# Patient Record
Sex: Male | Born: 1955 | Race: White | Hispanic: No | Marital: Married | Smoking: Never smoker
Health system: Southern US, Community
[De-identification: ages and names within clinical notes are randomized; demographics above are authoritative.]

## PROBLEM LIST (undated history)

## (undated) DIAGNOSIS — E785 Hyperlipidemia, unspecified: Secondary | ICD-10-CM

## (undated) DIAGNOSIS — R569 Unspecified convulsions: Secondary | ICD-10-CM

## (undated) DIAGNOSIS — E878 Other disorders of electrolyte and fluid balance, not elsewhere classified: Secondary | ICD-10-CM

## (undated) DIAGNOSIS — F329 Major depressive disorder, single episode, unspecified: Secondary | ICD-10-CM

## (undated) DIAGNOSIS — F419 Anxiety disorder, unspecified: Secondary | ICD-10-CM

## (undated) DIAGNOSIS — J189 Pneumonia, unspecified organism: Secondary | ICD-10-CM

## (undated) HISTORY — DX: Major depressive disorder, single episode, unspecified: F32.9

## (undated) HISTORY — PX: TONSILLECTOMY: SUR1361

## (undated) HISTORY — DX: Anxiety disorder, unspecified: F41.9

## (undated) HISTORY — DX: Other disorders of electrolyte and fluid balance, not elsewhere classified: E87.8

## (undated) HISTORY — DX: Pneumonia, unspecified organism: J18.9

## (undated) HISTORY — DX: Hyperlipidemia, unspecified: E78.5

## (undated) HISTORY — DX: Unspecified convulsions: R56.9

---

## 2003-06-04 ENCOUNTER — Encounter: Payer: Self-pay | Admitting: Internal Medicine

## 2003-06-04 ENCOUNTER — Ambulatory Visit (HOSPITAL_COMMUNITY): Admission: RE | Admit: 2003-06-04 | Discharge: 2003-06-04 | Payer: Self-pay | Admitting: Internal Medicine

## 2006-02-24 DIAGNOSIS — F419 Anxiety disorder, unspecified: Secondary | ICD-10-CM

## 2006-02-24 HISTORY — DX: Anxiety disorder, unspecified: F41.9

## 2006-05-27 DIAGNOSIS — E878 Other disorders of electrolyte and fluid balance, not elsewhere classified: Secondary | ICD-10-CM

## 2006-05-27 DIAGNOSIS — F32A Depression, unspecified: Secondary | ICD-10-CM

## 2006-05-27 DIAGNOSIS — J189 Pneumonia, unspecified organism: Secondary | ICD-10-CM

## 2006-05-27 DIAGNOSIS — F419 Anxiety disorder, unspecified: Secondary | ICD-10-CM

## 2006-05-27 HISTORY — DX: Other disorders of electrolyte and fluid balance, not elsewhere classified: E87.8

## 2006-05-27 HISTORY — DX: Anxiety disorder, unspecified: F41.9

## 2006-05-27 HISTORY — DX: Depression, unspecified: F32.A

## 2006-05-27 HISTORY — DX: Pneumonia, unspecified organism: J18.9

## 2006-06-08 ENCOUNTER — Other Ambulatory Visit: Payer: Self-pay

## 2006-06-08 ENCOUNTER — Inpatient Hospital Stay: Payer: Self-pay | Admitting: Internal Medicine

## 2006-06-19 ENCOUNTER — Ambulatory Visit: Payer: Self-pay | Admitting: Pulmonary Disease

## 2006-09-27 ENCOUNTER — Ambulatory Visit: Payer: Self-pay | Admitting: Family Medicine

## 2006-09-27 DIAGNOSIS — E785 Hyperlipidemia, unspecified: Secondary | ICD-10-CM

## 2006-09-27 HISTORY — DX: Hyperlipidemia, unspecified: E78.5

## 2006-09-27 LAB — CONVERTED CEMR LAB
ALT: 33 units/L (ref 0–40)
AST: 35 units/L (ref 0–37)
Albumin: 4.3 g/dL (ref 3.5–5.2)
Alkaline Phosphatase: 79 units/L (ref 39–117)
BUN: 6 mg/dL (ref 6–23)
Basophils Absolute: 0 10*3/uL (ref 0.0–0.1)
Basophils Relative: 0.3 % (ref 0.0–1.0)
Bilirubin, Direct: 0.1 mg/dL (ref 0.0–0.3)
CO2: 28 meq/L (ref 19–32)
Calcium: 9.4 mg/dL (ref 8.4–10.5)
Chloride: 103 meq/L (ref 96–112)
Cholesterol: 200 mg/dL (ref 0–200)
Creatinine, Ser: 1 mg/dL (ref 0.4–1.5)
Eosinophils Absolute: 0.1 10*3/uL (ref 0.0–0.6)
Eosinophils Relative: 1.2 % (ref 0.0–5.0)
GFR calc Af Amer: 102 mL/min
GFR calc non Af Amer: 84 mL/min
Glucose, Bld: 98 mg/dL (ref 70–99)
HCT: 46.1 % (ref 39.0–52.0)
HDL: 49.3 mg/dL (ref >39.0)
Hemoglobin: 16 g/dL (ref 13.0–17.0)
LDL Cholesterol: 131 mg/dL — ABNORMAL HIGH (ref 0–99)
Lymphocytes Relative: 29.2 % (ref 12.0–46.0)
MCHC: 34.6 g/dL (ref 30.0–36.0)
MCV: 91.5 fL (ref 78.0–100.0)
Monocytes Absolute: 0.5 10*3/uL (ref 0.2–0.7)
Monocytes Relative: 10 % (ref 3.0–11.0)
Neutro Abs: 2.7 10*3/uL (ref 1.4–7.7)
Neutrophils Relative %: 59.3 % (ref 43.0–77.0)
PSA: 0.46 ng/mL
PSA: 0.46 ng/mL (ref 0.10–4.00)
Platelets: 200 10*3/uL (ref 150–400)
Potassium: 3.8 meq/L (ref 3.5–5.1)
RBC: 5.04 M/uL (ref 4.22–5.81)
RDW: 12 % (ref 11.5–14.6)
Sodium: 142 meq/L (ref 135–145)
TSH: 1.71 microintl units/mL (ref 0.35–5.50)
Total Bilirubin: 0.7 mg/dL (ref 0.3–1.2)
Total CHOL/HDL Ratio: 4.1
Total Protein: 6.7 g/dL (ref 6.0–8.3)
Triglycerides: 99 mg/dL (ref 0–149)
VLDL: 20 mg/dL (ref 0–40)
WBC: 4.7 10*3/uL (ref 4.5–10.5)

## 2006-10-15 ENCOUNTER — Ambulatory Visit: Payer: Self-pay | Admitting: Family Medicine

## 2006-11-27 ENCOUNTER — Encounter: Payer: Self-pay | Admitting: Family Medicine

## 2006-11-27 DIAGNOSIS — E785 Hyperlipidemia, unspecified: Secondary | ICD-10-CM | POA: Insufficient documentation

## 2006-11-27 DIAGNOSIS — F411 Generalized anxiety disorder: Secondary | ICD-10-CM | POA: Insufficient documentation

## 2006-11-28 ENCOUNTER — Ambulatory Visit: Payer: Self-pay | Admitting: Family Medicine

## 2007-06-23 ENCOUNTER — Telehealth (INDEPENDENT_AMBULATORY_CARE_PROVIDER_SITE_OTHER): Payer: Self-pay | Admitting: *Deleted

## 2007-07-16 ENCOUNTER — Ambulatory Visit: Payer: Self-pay | Admitting: Family Medicine

## 2007-08-04 ENCOUNTER — Telehealth: Payer: Self-pay | Admitting: Family Medicine

## 2007-08-05 ENCOUNTER — Encounter: Payer: Self-pay | Admitting: Family Medicine

## 2007-12-23 ENCOUNTER — Emergency Department: Payer: Self-pay | Admitting: Emergency Medicine

## 2008-06-09 ENCOUNTER — Ambulatory Visit: Payer: Self-pay | Admitting: Family Medicine

## 2008-06-11 ENCOUNTER — Ambulatory Visit: Payer: Self-pay | Admitting: Family Medicine

## 2008-06-11 DIAGNOSIS — R7309 Other abnormal glucose: Secondary | ICD-10-CM

## 2008-06-14 LAB — CONVERTED CEMR LAB
ALT: 51 units/L (ref 0–53)
AST: 35 units/L (ref 0–37)
Albumin: 4.2 g/dL (ref 3.5–5.2)
Alkaline Phosphatase: 88 units/L (ref 39–117)
BUN: 18 mg/dL (ref 6–23)
Basophils Absolute: 0 10*3/uL (ref 0.0–0.1)
Basophils Relative: 0.1 % (ref 0.0–3.0)
Bilirubin, Direct: 0.1 mg/dL (ref 0.0–0.3)
CO2: 27 meq/L (ref 19–32)
Calcium: 9.2 mg/dL (ref 8.4–10.5)
Chloride: 107 meq/L (ref 96–112)
Cholesterol: 221 mg/dL (ref 0–200)
Creatinine, Ser: 1.1 mg/dL (ref 0.4–1.5)
Direct LDL: 124.9 mg/dL
Eosinophils Absolute: 0.1 10*3/uL (ref 0.0–0.7)
Eosinophils Relative: 1.2 % (ref 0.0–5.0)
GFR calc Af Amer: 91 mL/min
GFR calc non Af Amer: 75 mL/min
Glucose, Bld: 103 mg/dL — ABNORMAL HIGH (ref 70–99)
HCT: 45.2 % (ref 39.0–52.0)
HDL: 48.5 mg/dL (ref >39.0)
Hemoglobin: 15.7 g/dL (ref 13.0–17.0)
Lymphocytes Relative: 14.5 % (ref 12.0–46.0)
MCHC: 34.8 g/dL (ref 30.0–36.0)
MCV: 92.5 fL (ref 78.0–100.0)
Monocytes Absolute: 0.5 10*3/uL (ref 0.1–1.0)
Monocytes Relative: 7.7 % (ref 3.0–12.0)
Neutro Abs: 5.4 10*3/uL (ref 1.4–7.7)
Neutrophils Relative %: 76.5 % (ref 43.0–77.0)
PSA: 0.5 ng/mL (ref 0.10–4.00)
Platelets: 173 10*3/uL (ref 150–400)
Potassium: 4 meq/L (ref 3.5–5.1)
RBC: 4.89 M/uL (ref 4.22–5.81)
RDW: 11.8 % (ref 11.5–14.6)
Sodium: 141 meq/L (ref 135–145)
TSH: 2.14 microintl units/mL (ref 0.35–5.50)
Total Bilirubin: 0.9 mg/dL (ref 0.3–1.2)
Total CHOL/HDL Ratio: 4.6
Total Protein: 6.6 g/dL (ref 6.0–8.3)
Triglycerides: 100 mg/dL (ref 0–149)
VLDL: 20 mg/dL (ref 0–40)
WBC: 7 10*3/uL (ref 4.5–10.5)

## 2008-11-09 ENCOUNTER — Ambulatory Visit: Payer: Self-pay | Admitting: Family Medicine

## 2008-11-09 DIAGNOSIS — N401 Enlarged prostate with lower urinary tract symptoms: Secondary | ICD-10-CM

## 2009-08-25 ENCOUNTER — Telehealth: Payer: Self-pay | Admitting: Family Medicine

## 2009-10-28 ENCOUNTER — Telehealth: Payer: Self-pay | Admitting: Internal Medicine

## 2010-02-13 ENCOUNTER — Telehealth: Payer: Self-pay | Admitting: Family Medicine

## 2010-02-14 ENCOUNTER — Telehealth: Payer: Self-pay | Admitting: Family Medicine

## 2010-03-20 ENCOUNTER — Telehealth: Payer: Self-pay | Admitting: Family Medicine

## 2010-03-30 ENCOUNTER — Encounter (INDEPENDENT_AMBULATORY_CARE_PROVIDER_SITE_OTHER): Payer: Self-pay | Admitting: *Deleted

## 2010-08-27 DIAGNOSIS — R569 Unspecified convulsions: Secondary | ICD-10-CM

## 2010-08-27 HISTORY — DX: Unspecified convulsions: R56.9

## 2010-08-30 ENCOUNTER — Telehealth: Payer: Self-pay | Admitting: Family Medicine

## 2010-08-31 ENCOUNTER — Ambulatory Visit
Admission: RE | Admit: 2010-08-31 | Discharge: 2010-08-31 | Payer: Self-pay | Source: Home / Self Care | Attending: Family Medicine | Admitting: Family Medicine

## 2010-08-31 ENCOUNTER — Other Ambulatory Visit: Payer: Self-pay | Admitting: Family Medicine

## 2010-08-31 LAB — HEPATIC FUNCTION PANEL
ALT: 34 U/L (ref 0–53)
AST: 25 U/L (ref 0–37)
Albumin: 4.1 g/dL (ref 3.5–5.2)
Alkaline Phosphatase: 73 U/L (ref 39–117)
Bilirubin, Direct: 0.1 mg/dL (ref 0.0–0.3)
Total Bilirubin: 0.7 mg/dL (ref 0.3–1.2)
Total Protein: 6.6 g/dL (ref 6.0–8.3)

## 2010-08-31 LAB — BASIC METABOLIC PANEL
BUN: 18 mg/dL (ref 6–23)
CO2: 28 mEq/L (ref 19–32)
Calcium: 9.1 mg/dL (ref 8.4–10.5)
Chloride: 103 mEq/L (ref 96–112)
Creatinine, Ser: 1.1 mg/dL (ref 0.4–1.5)
GFR: 75 mL/min (ref >60.00)
Glucose, Bld: 95 mg/dL (ref 70–99)
Potassium: 4.4 mEq/L (ref 3.5–5.1)
Sodium: 138 mEq/L (ref 135–145)

## 2010-08-31 LAB — LIPID PANEL
Cholesterol: 230 mg/dL — ABNORMAL HIGH (ref 0–200)
HDL: 47.8 mg/dL (ref >39.00)
Total CHOL/HDL Ratio: 5
Triglycerides: 82 mg/dL (ref 0.0–149.0)
VLDL: 16.4 mg/dL (ref 0.0–40.0)

## 2010-08-31 LAB — LDL CHOLESTEROL, DIRECT: Direct LDL: 155.8 mg/dL

## 2010-09-01 ENCOUNTER — Ambulatory Visit
Admission: RE | Admit: 2010-09-01 | Discharge: 2010-09-01 | Payer: Self-pay | Source: Home / Self Care | Attending: Family Medicine | Admitting: Family Medicine

## 2010-09-01 DIAGNOSIS — M549 Dorsalgia, unspecified: Secondary | ICD-10-CM | POA: Insufficient documentation

## 2010-09-03 ENCOUNTER — Telehealth: Payer: Self-pay | Admitting: Family Medicine

## 2010-09-03 DIAGNOSIS — R569 Unspecified convulsions: Secondary | ICD-10-CM | POA: Insufficient documentation

## 2010-09-26 NOTE — Letter (Signed)
Summary: Nadara Eaton letter  [facility]  [address] [place], [place] [address]   Phone: [phone]  Fax: [phone]       03/30/2010 MRN: [id number]  [facility] [address] [place], [place]  [address]  Dear Mr. Copland,  [facility] [place] and [place] announce the retirement of Arta Silence, M.D., from full-time practice at the [facility] office effective February 23, 2010 and his plans of returning part-time.  It is important to Dr. Hetty Ely and to our practice that you understand that [facility] has seven physicians in our office for your health care needs.  We will continue to offer the same exceptional care that you have today.    Dr. Hetty Ely has spoken to many of you about his plans for retirement and returning part-time in the fall.   We will continue to work with you through the transition to schedule appointments for you in the office and meet the high standards that [facility] is committed to.   Again, it is with great pleasure that we share the news that Dr. Hetty Ely will return to [facility] in October of 2011 with a reduced schedule.    If you have any questions, or would like to request an appointment with one of our physicians, please call us at [phone] and press the option for Scheduling an appointment.  We take pleasure in providing you with excellent patient care and look forward to seeing you at your next office visit.  Our [facility] Physicians are:  Tillman Abide, M.D. Laurita Quint, M.D. Roxy Manns, M.D. Kerby Nora, M.D. Hannah Beat, M.D. Ruthe Mannan, M.D. We proudly welcomed Raechel Ache, M.D. and Eustaquio Boyden, M.D. to the practice in July/August 2011.  Sincerely,  [facility]

## 2010-09-26 NOTE — Progress Notes (Signed)
Summary: Questions about med  Phone Note Call from Patient Call back at [phone]   Caller: Patient Call For: Shaune Leeks MD Summary of Call: Request a call from you about his medication.  Initial call taken by: Sydell Axon LPN,  February 14, 2010 4:20 PM  Follow-up for Phone Call        Pt is having lots of poressure at work trying to see all the pt's he is to see a day and has needed something in the middle of the day to calm him down. He has not been on Zoloft for some time and was tqlking to a friend who is on Celexa. He wondered my opinion. I think Zoloft is an excellent medication for anxiety, has been around longer with more experience and he has already used it successfully in the past. My suggestion would be to use Zoloft. He has been breaking his Xanax in two. I have called in a higher dose to be used which should help altho he needs tobe seen to get more. Follow-up by: Shaune Leeks MD,  February 14, 2010 4:45 PM    New/Updated Medications: ZOLOFT 50 MG TABS (SERTRALINE HCL) 1/2 tab by mouth daily for 4-5 days then 1 tab Prescriptions: ZOLOFT 50 MG TABS (SERTRALINE HCL) 1/2 tab by mouth daily for 4-5 days then 1 tab  #30 x 12   Entered and Authorized by:   Shaune Leeks MD   Signed by:   Shaune Leeks MD on 02/14/2010   Method used:   [facility]ron[facility]        [facility] [phone]* (retail)       [address] [place], [place]  [address]       Ph: [phone] or [phone]       Fax: [phone]   RxID:   [id number]

## 2010-09-26 NOTE — Progress Notes (Signed)
Summary: Rx Zoloft  Phone Note Refill Request Call back at Home Phone [phone] Message from:  Patient on October 28, 2009 9:24 AM  Refills Requested: Medication #1:  ALPRAZOLAM 0.25 MG  TABS 1 by mouth two times a day prn Patient request refill, he is aware that Dr. Hetty Ely is out of the office today and that I will send the message to another provider but there is no guarantee that it will be filled.  Please advise.   Method Requested: Telephone to Pharmacy Initial call taken by: Linde Gillis CMA Duncan Dull),  October 28, 2009 9:25 AM  Follow-up for Phone Call        okay to refill for 3 months have him schedule physical with Dr Hetty Ely within that time Follow-up by: Cindee Salt MD,  October 28, 2009 10:47 AM  Additional Follow-up for Phone Call Additional follow up Details #1::        Rx called to pharmacy, lmom that pt would need to schedule appt for physical Additional Follow-up by: DeShannon Katrinka Blazing CMA Duncan Dull),  October 28, 2009 12:03 PM    Prescriptions: ALPRAZOLAM 0.25 MG  TABS (ALPRAZOLAM) 1 by mouth two times a day prn  #60 x 2   Entered by:   Mervin Hack CMA (AAMA)   Authorized by:   Cindee Salt MD   Signed by:   Mervin Hack CMA (AAMA) on 10/28/2009   Method used:   Telephoned to ...       [facility] [phone]* (retail)       [address] [place], [place]  [address]       Ph: [phone] or [phone]       Fax: [phone]   RxID:   [id number]

## 2010-09-26 NOTE — Progress Notes (Signed)
Summary: Alprazolam  Phone Note Call from Patient Call back at Home Phone [phone]   Caller: Patient Call For: Shaune Leeks MD Summary of Call: Patient says that this 1/2 dose is just not doing it for him.  He was using it mainly to sleep but now he thinks he probably needs it just to get through the day to keep from worrying so much.  He is asking if you would phone in a higher dose and more tablets to last him until he can get in with one of the new MD's.  He says that will probably take 2-3 months.  I told him I thought he could likely get in within a month or so.  He asked me to send you this note anyway. Initial call taken by: Delilah Shan CMA Duncan Dull),  February 14, 2010 9:01 AM  Follow-up for Phone Call        Can call in if so desired. Shaune Leeks MD  February 14, 2010 9:33 AM   Medication phoned to pharmacy. Patient Advised.  Follow-up by: Delilah Shan CMA Duncan Dull),  February 14, 2010 9:51 AM    New/Updated Medications: ALPRAZOLAM 0.5 MG TBDP (ALPRAZOLAM) one tab by mouth two times a day as needed anxiety Prescriptions: ALPRAZOLAM 0.5 MG TBDP (ALPRAZOLAM) one tab by mouth two times a day as needed anxiety  #30 x 1   Entered and Authorized by:   Shaune Leeks MD   Signed by:   Shaune Leeks MD on 02/14/2010   Method used:   Print then Give to Patient   RxID:   [id number]

## 2010-09-26 NOTE — Progress Notes (Signed)
Summary: refill request for alprazolam  Phone Note Refill Request Message from:  Fax from Pharmacy  Refills Requested: Medication #1:  ALPRAZOLAM 0.5 MG TBDP one tab by mouth two times a day as needed anxiety   Last Refilled: 02/26/2010 [facility] [address]   [phone]  Initial call taken by: Lowella Petties CMA,  March 20, 2010 8:39 AM  Follow-up for Phone Call        please fill with same sig, #30, 1rf.  thanks.  have pt keep 8/11 appointment.  Follow-up by: Crawford Givens MD,  March 20, 2010 12:51 PM  Additional Follow-up for Phone Call Additional follow up Details #1::        Medication phoned to pharmacy.  Additional Follow-up by: Delilah Shan CMA Rene Sizelove Dull),  March 20, 2010 12:53 PM     Appended Document: refill request for alprazolam     Clinical Lists Changes  Medications: Rx of ALPRAZOLAM 0.5 MG TBDP (ALPRAZOLAM) one tab by mouth two times a day as needed anxiety;  #30 x 1;  Signed;  Entered by: Delilah Shan CMA (AAMA);  Authorized by: Crawford Givens MD;  Method used: Telephoned to    Prescriptions: ALPRAZOLAM 0.5 MG TBDP (ALPRAZOLAM) one tab by mouth two times a day as needed anxiety  #30 x 1   Entered by:   Delilah Shan CMA (AAMA)   Authorized by:   Crawford Givens MD   Signed by:   Delilah Shan CMA (AAMA) on 03/20/2010   Method used:   Telephoned to ...         RxID:   [id number]

## 2010-09-26 NOTE — Progress Notes (Signed)
Summary: Alprazolam  Phone Note Refill Request Message from:  Fax from Pharmacy on February 13, 2010 3:15 PM  Refills Requested: Medication #1:  ALPRAZOLAM 0.25 MG  TABS 1 by mouth two times a day prn [facility]  Phone:   [phone]   Method Requested: Telephone to Pharmacy Initial call taken by: Delilah Shan CMA Duncan Dull),  February 13, 2010 3:15 PM  Follow-up for Phone Call        Dr Alvester Morin needs to schedule a PE, now with Dr Para March or Dr Sharen Hones. They will probably not fill prescription w/o being seen. Follow-up by: Shaune Leeks MD,  February 13, 2010 3:50 PM  Additional Follow-up for Phone Call Additional follow up Details #1::        Medication phoned to pharmacy. Letter mailed.  Additional Follow-up by: Delilah Shan CMA Duncan Dull),  February 14, 2010 8:53 AM    Prescriptions: ALPRAZOLAM 0.25 MG  TABS (ALPRAZOLAM) 1 by mouth two times a day prn  #60 x 0   Entered and Authorized by:   Shaune Leeks MD   Signed by:   Shaune Leeks MD on 02/13/2010   Method used:   Telephoned to ...         RxID:   [id number]

## 2010-09-28 NOTE — Assessment & Plan Note (Signed)
Summary: TRANSFER FROM [facility]   Vital Signs:  Patient profile:   55 year old male Height:      69.50 inches Weight:      211 pounds BMI:     30.82 Temp:     98.2 degrees F oral Pulse rate:   84 / minute Pulse rhythm:   regular BP sitting:   116 / 80  (left arm) Cuff size:   large  Vitals Entered By: Delilah Shan CMA Waseem Suess Dull) 09-14-10 9:59 AM) CC: Transfer from [facility] - Lab report given to patient.  Pt. has concerns re:  request for pancreatic enzymes (see phone note), Preventive Care   History of Present Illness: CPE- see plan.  Labs reviewed with patient in detail.   Anxiety and insomnia- has been on zoloft episodically.  didn't have sig relief.  Taking xanax occ; prev had been up to 1 a day.  No SI/HI.  Now taking xanax intermittently.   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  I have no records to review about this.  No recent symptoms.   Occ L lower back pain, resolved now.  This had been going on for a few days before the OV.  No radiation into legs.  No FCNAVD. No blood in stool.    Allergies: No Known Drug Allergies  Past History:  Family History: Last updated: 09-14-2010 Father:DECEASED AGE 11/BRAIN  GLIOBLASTOMA/ HTN::  Mother::A Cataracts  both knee and both hips replaced Siblings:1 BROTHER ALIVE TESTICULR. CA and pituitary anenoma, HTN 1 BROTHER, MELONOMA  UJ:[id number] HBP: + FATHER, BROTHER DM NEGATIVE  PROSTATE CANCER:  NEGATIVE COLON CANCER: NEGATIVE DEPRESSION: + SELF (STILAMAN) ETOH/DRUG ABUSE + M UNCLE OTHER: STROKE NEGATIVE GF with h/o SZ d/o.    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   Past Surgical History: [facility]: PNEUMONIA / ELECTROLYTE DISORDER;  ANX./ DEPR. 10/13-10/17/2007 TONSILLECTOMY  CHILDHOOD COLONOSCOPY NML (DR ELLIOTT) 2003  Family History: Reviewed history from 11/09/2008 and no changes required. Father:DECEASED AGE 11/BRAIN  GLIOBLASTOMA/ HTN::  Mother::A Cataracts  both knee and both hips replaced Siblings:1 BROTHER ALIVE TESTICULR. CA and pituitary anenoma, HTN 1 BROTHER, MELONOMA  GN:FAOZHYQM HBP: + FATHER, BROTHER DM NEGATIVE  PROSTATE CANCER:  NEGATIVE COLON CANCER: NEGATIVE DEPRESSION: + SELF (STILAMAN) ETOH/DRUG ABUSE + M UNCLE OTHER: STROKE NEGATIVE GF with h/o SZ d/o.    Social History: Reviewed history from 11/09/2008 and no changes required. 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  Otherwise negative.    Physical Exam  General:  GEN: nad, alert and oriented HEENT: mucous membranes moist NECK: supple w/o LA CV: rrr.  no murmur PULM: ctab, no inc wob ABD: soft, +bs, no masses, no HSM BACK: not tender to palpation in midline.  He has been tender in the bilateral paraspinal lumbar muscles. no skin changes EXT: no edema SKIN: no acute rash  Rectal:  No external abnormalities noted. Normal sphincter tone. No rectal masses or tenderness. Prostate:  Prostate gland firm and smooth, no enlargement, nodularity, tenderness, mass, asymmetry or induration.   Impression & Recommendations:  Problem #  1:  Preventive Health Care (ICD-V70.0) Pt declined flu shot.  He is aware of potential mortality benefit with vaccine.  He declines anyway.  D/w patient at length about changes in PSA/prostate CA screening.  he is low risk w/o family history and normal DRE.  PSA is not recommended for him based on guidelines.  He understands.  No indication for checking pancreatic enzymes. He needs to work on diet/weight/lipids.   Problem # 2:  ANXIETY (ICD-300.00) He  can take the xanax as needed and if needed frequently, he'll notify the clinic about restarting the SSRI.  No SI/HI.  follow up as needed.  Sedation caution given for xanax.  His updated medication list for this problem includes:    Alprazolam 0.5 Mg Tbdp (Alprazolam) ..... One tab by mouth two times a day as needed anxiety    Zoloft 50 Mg Tabs (Sertraline hcl) .Marland Kitchen... 1/2 tab by mouth daily for 4-5 days then 1 tab  Problem # 3:  BACK PAIN (ICD-724.5) Resolved, follow up as needed.  Likely benign, self limited L spine muscle strain.   Complete Medication List: 1)  Alprazolam 0.5 Mg Tbdp (Alprazolam) .... One tab by mouth two times a day as needed anxiety 2)  Zoloft 50 Mg Tabs (Sertraline hcl) .... 1/2 tab by mouth daily for 4-5 days then 1 tab  Colorectal Screening:  Current Recommendations:    Hemoccult: NEG X 1 today  PSA Screening:    PSA: 0.50  (06/11/2008)  Immunization & Chemoprophylaxis:    Tetanus vaccine: Td  (09/27/2006)  Patient Instructions: 1)  I would get a flu shot.  2)  Stretch your back and let me know if you continue to have symptoms.  3)  I would increase your level of exercise and recheck your lipids in 6 months.  Fasting lipid panel 272.0 4)  Take care.  Prescriptions: ZOLOFT 50 MG TABS (SERTRALINE HCL) 1/2 tab by mouth daily for 4-5 days then 1 tab  #30 x 12   Entered and Authorized by:   Crawford Givens MD   Signed by:   Crawford Givens MD on 09/01/2010   Method used:   Print then Give to Patient   RxID:   [id number] ALPRAZOLAM 0.5 MG TBDP (ALPRAZOLAM) one tab by mouth two times a day as needed anxiety  #30 x 1   Entered and Authorized by:   Crawford Givens MD   Signed by:   Crawford Givens MD on 09/01/2010   Method used:   Print then Give to Patient   RxID:   [id number]    Orders Added: 1)  New Patient 40-64 years [99386] 2)  Est. Patient Level III [24401]      Prevention & Chronic Care Immunizations   Influenza vaccine: Not documented     Tetanus booster: 09/27/2006: Td    Pneumococcal vaccine: Not documented  Colorectal Screening   Hemoccult: Not documented   Hemoccult action/deferral: NEG X 1 today  (09/01/2010)    Colonoscopy: Per old chart, normal colonosocpy 2003 with Dr. Mechele Collin but no record is currently available for review  (08/27/2001)  Other Screening   PSA: 0.50  (06/11/2008)   Smoking status: never  (11/09/2008)  Lipids   Total Cholesterol: 230  (08/31/2010)   LDL: DEL  (06/11/2008)   LDL Direct: 155.8  (08/31/2010)   HDL: 47.80  (08/31/2010)   Triglycerides: 82.0  (08/31/2010)    SGOT (AST): 25  (08/31/2010)   SGPT (ALT): 34  (08/31/2010)   Alkaline phosphatase:  73  (08/31/2010)   Total bilirubin: 0.7  (08/31/2010)  Self-Management Support :    Lipid self-management support: Not documented     Colonoscopy  Procedure date:  08/27/2001  Findings:      Per old chart, normal colonosocpy 2003 with Dr. Mechele Collin but no record is currently available for review

## 2010-09-28 NOTE — Progress Notes (Signed)
  Phone Note Outgoing Call   Summary of Call: Please call patient.  I have no hard copy records of his possible seizure disorder and work up.  I have no copies of his colonosocpy, which per the old paper chart was done by Dr. Mechele Collin in 2003.  If he can get records on either or both of those, I'll review them.  Thanks.  Initial call taken by: Crawford Givens MD,  September 03, 2010 5:45 PM  Follow-up for Phone Call        Patient Advised.   He says he has some copies of his records from [facility] and will submit those to you and he will see what he can get from [facility].  In the meantime, he is asking if you would be comfortable with a referral to Dr. Sandria Manly in [place] to be evaluated.  Delilah Shan CMA Ivi Griffith Dull)  September 04, 2010 11:41 AM Follow-up by: Crawford Givens MD,  September 04, 2010 1:23 PM  Additional Follow-up for Phone Call Additional follow up Details #1::        referal done.  I'll look at records when he drops them off. Crawford Givens MD  September 04, 2010 1:24 PM   Patient Advised.  Additional Follow-up by: Delilah Shan CMA (AAMA),  September 04, 2010 2:17 PM  New Problems: SEIZURE DISORDER (ICD-780.39)   New Problems: SEIZURE DISORDER (ICD-780.39)

## 2010-09-28 NOTE — Progress Notes (Signed)
Summary: requests lab work  Phone Note Call from Patient Call back at [employer] [phone]   Caller: Patient Call For: Dr. Para March Summary of Call: Pt is coming to see you on friday, transferring from Dr. Hetty Ely.  He is asking if he can come in tomorrow for labs, particularly wants liver, pancreas and PSA checked. Initial call taken by: Lowella Petties CMA, AAMA,  August 30, 2010 9:31 AM  Follow-up for Phone Call        draw cmet/lipid 790.29 we can discuss PSA at OV.  I would not draw before the OV.  I see no indication for drawing 'pancreas' labs based on his history.  we can discuss at OV. Follow-up by: Crawford Givens MD,  August 30, 2010 11:29 AM  Additional Follow-up for Phone Call Additional follow up Details #1::        Patient Advised.   Lab appointment scheduled  08/31/2010. Additional Follow-up by: Delilah Shan CMA (AAMA),  August 30, 2010 11:48 AM    D

## 2010-10-19 ENCOUNTER — Encounter: Payer: Self-pay | Admitting: Family Medicine

## 2010-11-02 NOTE — Letter (Signed)
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 Eval by Dr. 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 Eval by Dr. Sandria Manly 2012

## 2011-01-12 NOTE — Assessment & Plan Note (Signed)
[facility]                             PULMONARY OFFICE NOTE   NAME:Jorge Obrien, Jorge Obrien                        MRN:          [id number]  DATE:06/19/2006                            DOB:          1956/08/22    REFERRING PHYSICIAN:  Self Referral   HISTORY OF PRESENT ILLNESS:  The patient is a 55 year old male who comes  in today for further pulmonary evaluation after a recent episode of  pneumonia. The patient was recently admitted to [facility] for 4 days with multilobar pneumonia. The patient has now been  out 7 days and feels that things are getting better but he just wanted a  second opinion regarding the possible etiology and whether the treatment  was appropriate or not. The patient feels that he is much improved and  that he has had no fevers, chills or sweats and his mucus is now white.  His shortness of breath is improving, but his primary complaint is that  of fatigue. I have reassured him this is fairly normal. This all began  with postnasal drip and hoarseness and eventually went down into his  chest. He began to develop shortness of breath, chest pain, fever and  nonproductive cough. He went to the emergency room where he was  documented to have multilobar pneumonia with hypotension and hemoptysis.  All of the cultures were negative while in the hospital but he was  treated with Levaquin IV and then changed over to p.o. He is currently  still on Levaquin.   PAST MEDICAL HISTORY:  Totally unremarkable.   CURRENT MEDICATIONS:  1. Celexa unknown dose daily.  2. Levaquin daily.   He is allergic to OMNICEF.   SOCIAL HISTORY:  The patient works as an Sport and exercise psychologist, is married and has  children. He smokes an occasional cigar and has never smoked cigarettes.   FAMILY HISTORY:  Remarkable for his father having had brain cancer and  brother with testicular cancer and malignant melanoma.   REVIEW OF SYSTEMS:  As per history of  present illness and also see  patient intake form documented in the chart.   PHYSICAL EXAMINATION:  GENERAL:  He is a well-developed male in no acute  distress.  VITAL SIGNS:  Blood pressure is 110/68, pulse 57, temperature 97.5,  weight is 185 pounds, O2 saturation on room air is 97%.  HEENT:  Pupils equal round and reactive to light and accommodation.  Extraocular muscles are intact. Nares are patent without discharge.  Oropharynx is clear.  NECK:  Supple without JVD or lymphadenopathy. There is no palpable  thyromegaly.  CHEST:  Totally clear to auscultation.  CARDIAC:  Reveals regular rate and rhythm, no murmurs, rubs or gallops.  ABDOMEN:  Soft, nontender with good bowel sounds.  GENITAL/RECTAL/BREASTS:  Not done and not indicated.  EXTREMITIES:  Lower extremities are without edema, good pulses distally,  no calf tenderness.  NEUROLOGIC:  Alert and oriented with no obvious motor deficits.   IMPRESSION:  Severe community-acquired pneumonia which is improving. The  patient is no longer having fever and his mucus  is now clear. He is  slowly getting back to his usual baseline. There are no crackles or  wheezes on exam. I think he needs to finish up his antibiotics and at  some point in the next 4-6 weeks he should have a followup chest x-ray  to make sure that it is totally clear. The patient is agreeable to this  plan as outlined.   PLAN:  1. The patient will contact his primary care physician to get a      followup film in approximately 4-6 weeks for comparison to his film      at [facility]  2. No further followup is needed at this time but I will be happy to      see him on a p.r.n. basis.     Barbaraann Share, MD,FCCP  Electronically Signed    [facility]  DD: 08/08/2006  DT: 08/08/2006  Job #: [id number]

## 2011-01-12 NOTE — Assessment & Plan Note (Signed)
[facility]                           [facility] OFFICE NOTE   NAME:Jorge Obrien, Jorge Obrien                        MRN:          [id number]  DATE:10/15/2006                            DOB:          [birth year]    SUBJECTIVE:  A 55 year old white male who is new to the practice his  last visit, which was September 27, 2006.  The patient was seen by  problems for several months.  He had shingles on June 05, 2006, in  the right thoracic area, biopsy-proven shingles via dermatology.  He was  not given Valtrex for it.  He was found kind of serendipitously on  examination when the patient was diagnosed with pneumonia at the  beginning of November.  He was diagnosed with double pneumonia and was  hospitalized for five days at [facility].  At that time  he was having some hemoptysis which resolved when his pneumonia was  treated.  He had a CT scan at that time, to rule out a pulmonary  embolism, which was indeed negative.  He had pneumonia previously when  he was 44-1/54 years old.  He is currently under significant anxiety and  distress, going through a divorce.  He has not exercised for about six  months now.  He used to exercise every other day, which has helped him  with his anxiety prior.  He has had no blood work since being out of the  hospital.   He also complains of hurting in the hips, which radiates to the legs  and/or pelvis.  His hands hurt in the past for awhile, none now.  Again,  he is the product of a divorce, which was asked for one year ago.  He is  still living in the house with his wife and child.  The wife has been  hesitant on the divorce.   PAST MEDICAL HISTORY:  1. Hospitalized for the pneumonia as above in October and November      2007.  2. Tonsillectomy as a child.  3. Borderline cholesterol.  4. He has had Streptococcus in the past.  He denies rheumatic fever, scarlet fever, high blood pressure, heart  disease,  asthma, diabetes, liver disease or thyroid disease.   ALLERGIES:  OMNICEF which causes welts on the skin in June 2007.   SOCIAL HISTORY:  He has never smoked.  Drinks two beers a night three  times a week.  Does not use drugs.   MEDICATIONS:  1. Citalopram 40 mg daily, which he had not taken as of September 27, 2006, for two weeks, prescribed by Dr. Randa Lynn in July of last year.  2. Xanax 0.25 mg, 1/2 tab p.r.n., which he took sparingly.   REVIEW OF SYSTEMS:  Significant for wearing glasses since his teen  years.  His last exam was in 2006.  He was tested for Glaucoma.  Has  occasional ringing of the ears.  Last dental exam was in February 2007.  Shortness of breath since his pneumonia.  Mild left anterior chest pain  at times,  which is non-exertional.  Does not cause diaphoresis or  nausea.  Does not worsen with exercise, but nothing makes it better as  well.  He has occasional palpitations, hemoptysis with pneumonia, which  is now resolved. He was told he had bleeding from his sinuses at the  same time.  He has a history of hemorrhoids in the past and can tell  when he flares.  He has had rectal pain in the past in November and  December of this year, presumably from his prostate.  He had a  colonoscopy in 2003, by Dr. Mechele Collin that was normal.  He has had urinary  frequency with nocturia two to three times a night and dribbling, which  started last year.  Otherwise the HEENT, TEETH, GUMS, CARDIORESPIRATORY,  GASTROINTESTINAL AND  GENITOURINARY:  SYSTEMS are noncontributory.   FAMILY HISTORY:  He has a brother who has had melanoma and has had  dermatology evaluation secondary thereto.  His father died at age 15, of  a brain tumor with high blood pressure.  Mother is still alive at the  age of 80.  Two brothers, one at age 46, who has had left testicular  cancer since age 56 and high blood pressure.  One at age 1, with  melanoma.   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  He is separated  from his wife, filing for a divorce.  Still lives at home with the wife  and child.   HEALTH MAINTENANCE:  Last Tetanus shot is unknown.   PHYSICAL EXAMINATION:  GENERAL:  A well-developed and well-nourished 55-  year-old white male, in no acute distress.  VITAL SIGNS:  Temperature 98.4 degrees, pulse 64, blood pressure 120/60,  weight 199 pounds at height of 71 inches.  HEENT:  Within normal limits.  Hearing is within normal limits.  NECK:  Without adenopathy.  Thyroid without nodularity.  LUNGS:  Clear to auscultation.  No wheezes, rales or rhonchi are heard.  BACK:  Straight, nontender, with no CVA tenderness.  Range of motion of  the back is reasonably normal, with mild discomfort at the anterior  superior iliac spine on the left.  HEART:  A regular rate and rhythm without murmur.  Pulses 2+ throughout.  Carotids are without bruits.  CHEST:  Symmetric with good excursion.  ABDOMEN:  Soft, nontender.  Good bowel sounds, no masses.   ASSESSMENT:  1. Anxiety with mild depression, which is situational due to his      divorce.  2. Night sweats that have resolved since being seen the first time.  3. Elevated cholesterol.  4. History of pneumonia.   PLAN:  1. A Tetanus shot was given on September 27, 2006.  2. A CT scan of the lungs was reviewed.  It was significant only for      infiltrates during his pneumonia.  3.  Will start him on Zoloft      today on October 15, 2006, at 1/2 of a 50 mg tablet a day for      eight days and then one tablet a day, #30 and 12 refills.  3. Continue Xanax 0.25 mg, 1/2 to 1 tab t.i.d. p.r.n., #30 and no      refills.  He actually uses 1/2 tab as infrequently as five to seven      days.  4. The left anterior superior iliac spine discomfort is probably      secondary to latissimus dorsi strain.  It should  improve as time      goes on.  He was reassured.  Will discuss osteo facts     next time I see him.  5. Will get  fasting labs in the future.  6. Return as needed.  Will see him back in three months.     Arta Silence, MD  Electronically Signed    RNS/MedQ  DD: 10/15/2006  DT: 10/15/2006  Job #: [id number]

## 2011-11-01 ENCOUNTER — Other Ambulatory Visit: Payer: Self-pay | Admitting: *Deleted

## 2011-11-01 NOTE — Telephone Encounter (Signed)
Received fax refill request for Alprazolam 0.5mg , medication is not on med list.  Please advise.

## 2011-11-02 MED ORDER — ALPRAZOLAM 0.5 MG PO TABS: 0.5000 mg | ORAL_TABLET | Freq: Two times a day (BID) | ORAL | Status: AC | PRN

## 2011-11-02 NOTE — Telephone Encounter (Signed)
LMOVM of patient's home/cell phone.  Medication phoned to pharmacy.

## 2011-11-02 NOTE — Telephone Encounter (Signed)
Needs OV re: anxiety.  Not seen in 1 year.  Please call in med after verifying pharmacy.

## 2011-11-03 ENCOUNTER — Ambulatory Visit: Payer: Self-pay | Admitting: Internal Medicine

## 2011-11-16 ENCOUNTER — Telehealth: Payer: Self-pay

## 2011-11-16 NOTE — Telephone Encounter (Signed)
Spoke with pt and he verified Alprazolam was the only current med and asked that med list be mailed to [address] [place], [place] [address]. Mailed current med list as requested.

## 2011-11-16 NOTE — Telephone Encounter (Signed)
Pt left v/m that wanted current med list mailed to his home address [address] [place], [place] [address]. I called left v/m for pt to call back to verify his med list.

## 2012-07-02 ENCOUNTER — Other Ambulatory Visit: Payer: Self-pay

## 2012-07-02 NOTE — Telephone Encounter (Signed)
Pt request refill alprazolam; pt not seen since 09/01/10; pt said would call back to schedule appt. Pt also request copy of med list for insurance purposes. Med list mailed as requested.

## 2012-07-02 NOTE — Telephone Encounter (Signed)
Per EMR, message was left with patient to schedule appointment when rx was last filled in 3/13.  Needs to schedule OV.  At that point, call in #30, no rf, same sig.  Thanks.

## 2012-07-03 NOTE — Telephone Encounter (Signed)
Left detailed message on VM.

## 2012-07-08 ENCOUNTER — Encounter: Payer: Self-pay | Admitting: Family Medicine

## 2012-07-08 MED ORDER — ALPRAZOLAM 0.5 MG PO TABS: 0.5000 mg | ORAL_TABLET | Freq: Two times a day (BID) | ORAL | Status: DC | PRN

## 2012-07-08 NOTE — Telephone Encounter (Signed)
Patient scheduled appt.  Medication phoned to pharmacy.

## 2012-07-11 ENCOUNTER — Ambulatory Visit (INDEPENDENT_AMBULATORY_CARE_PROVIDER_SITE_OTHER): Payer: BC Managed Care – PPO | Admitting: Family Medicine

## 2012-07-11 ENCOUNTER — Encounter: Payer: Self-pay | Admitting: Family Medicine

## 2012-07-11 VITALS — BP 120/80 | HR 61 | Temp 97.7°F | Wt 220.0 lb

## 2012-07-11 DIAGNOSIS — F411 Generalized anxiety disorder: Secondary | ICD-10-CM

## 2012-07-11 DIAGNOSIS — R0789 Other chest pain: Secondary | ICD-10-CM

## 2012-07-11 DIAGNOSIS — G4733 Obstructive sleep apnea (adult) (pediatric): Secondary | ICD-10-CM

## 2012-07-11 NOTE — Progress Notes (Signed)
He was diagnosed with OSA and had seen neuro for this.  He is to schedule his titration study.  He is going to try to work on his weight.  Discussed, encouraged f/u with neuro re: OSA.   Episodic anxiety/insomnia. He may go a week with no meds, then episodically need the medicine. No ADE.  Not smoking.  Not drinking etoh.  No illicits.  We talked about limited/episodic use of BZD.    Episode of nonexertional atypical chest pain during period of stress at work. Since then, sx resolved and can exert w/o CP.  He has poor conditioning that he attributes to lifestyle.  No CP now.   PMH and SH reviewed  ROS: See HPI, otherwise noncontributory.  Meds, vitals, and allergies reviewed.    GEN: nad, alert and oriented HEENT: mucous membranes moist NECK: supple w/o LA CV: rrr.  no murmur PULM: ctab, no inc wob ABD: soft, +bs EXT: no edema SKIN: no acute rash

## 2012-07-11 NOTE — Patient Instructions (Signed)
I would get a flu shot each fall.   I would schedule a physical for the spring/summer 2014.

## 2012-07-12 ENCOUNTER — Encounter: Payer: Self-pay | Admitting: Family Medicine

## 2012-07-12 DIAGNOSIS — G4733 Obstructive sleep apnea (adult) (pediatric): Secondary | ICD-10-CM | POA: Insufficient documentation

## 2012-07-12 DIAGNOSIS — R0789 Other chest pain: Secondary | ICD-10-CM | POA: Insufficient documentation

## 2012-07-12 NOTE — Assessment & Plan Note (Signed)
Continue prn BZD for limited and episodic sx.  No ADE.

## 2012-07-12 NOTE — Assessment & Plan Note (Signed)
Likely combination of gerd, lifestyle and work stress. No CP now. No exertional sx.  Normal EKG. He'll notify if return of sx.  D/w pt about lifestyle mods and scheduling and CPE.

## 2012-07-12 NOTE — Assessment & Plan Note (Signed)
Requesting records, encouraged flu shot, declined by patient.  Encouraged f/u with neuro.

## 2013-01-02 IMAGING — CR DG CHEST 2V
1 series · 3 of 3 positions shown · non-contrast
Comparison: none

REASON FOR EXAM: pneumonia, cough and wheezing
COMMENTS:

PROCEDURE:     DXR - DXR CHEST PA (OR AP) AND LATERAL  - November 03, 2011  [DATE]
RESULT:     Comparison is made to prior study dated 12/23/2007.
There is no evidence of focal infiltrates, effusions or edema. The cardiac
silhouette and visualized bony skeleton are unremarkable.

[Series 1: pa · 0.17mm/px · 3 of 3 slices shown]
[im 1/3]
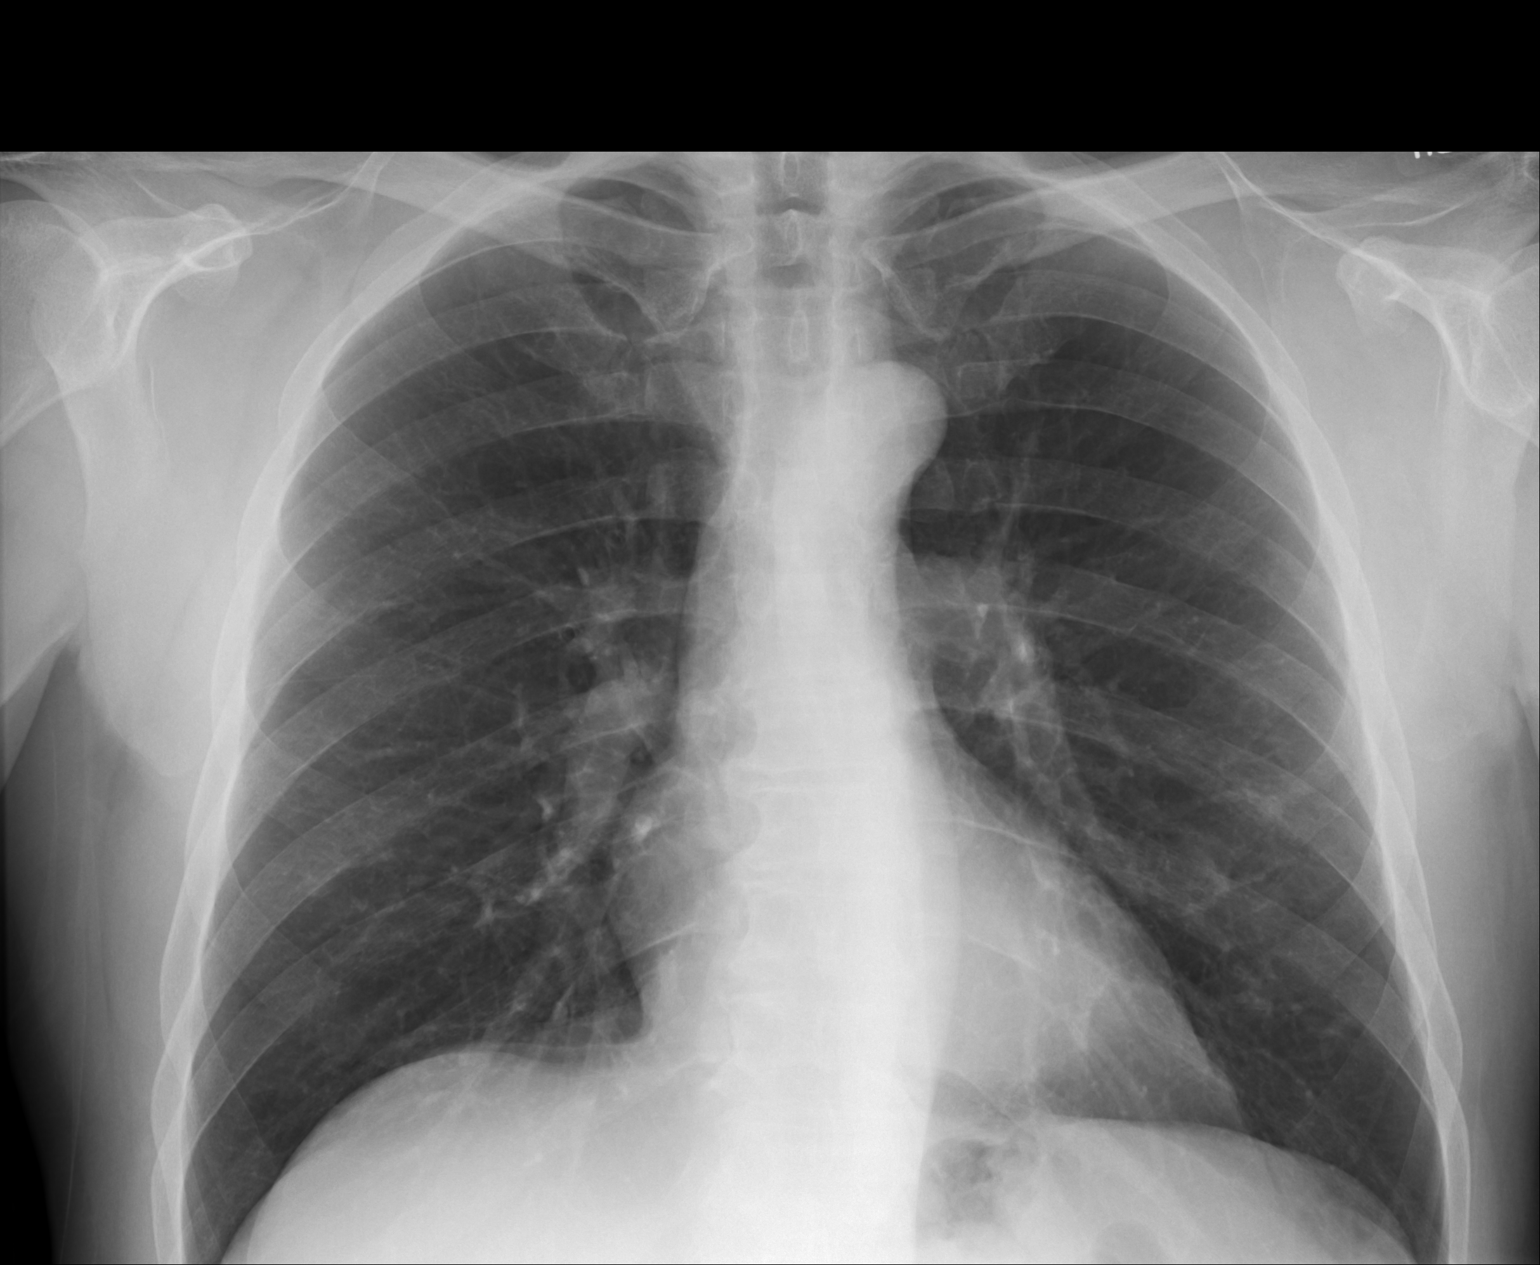
[im 2/3]
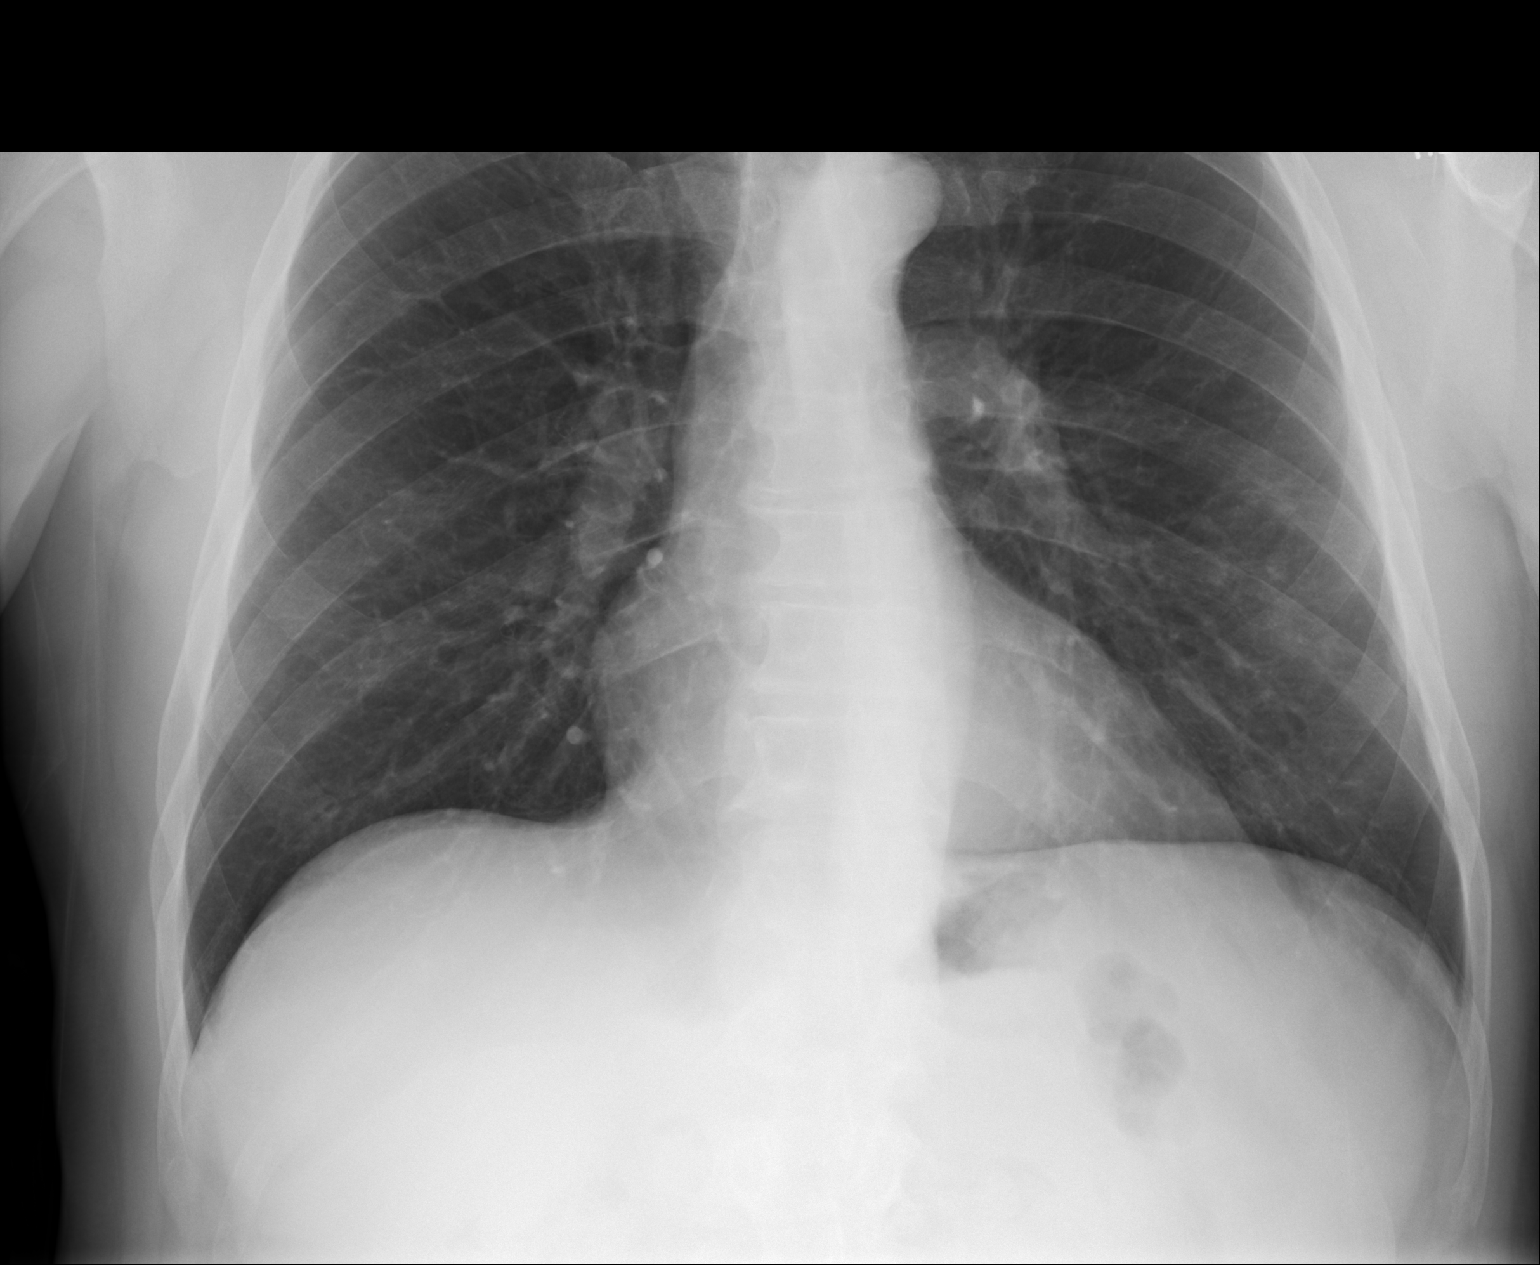
[im 3/3]
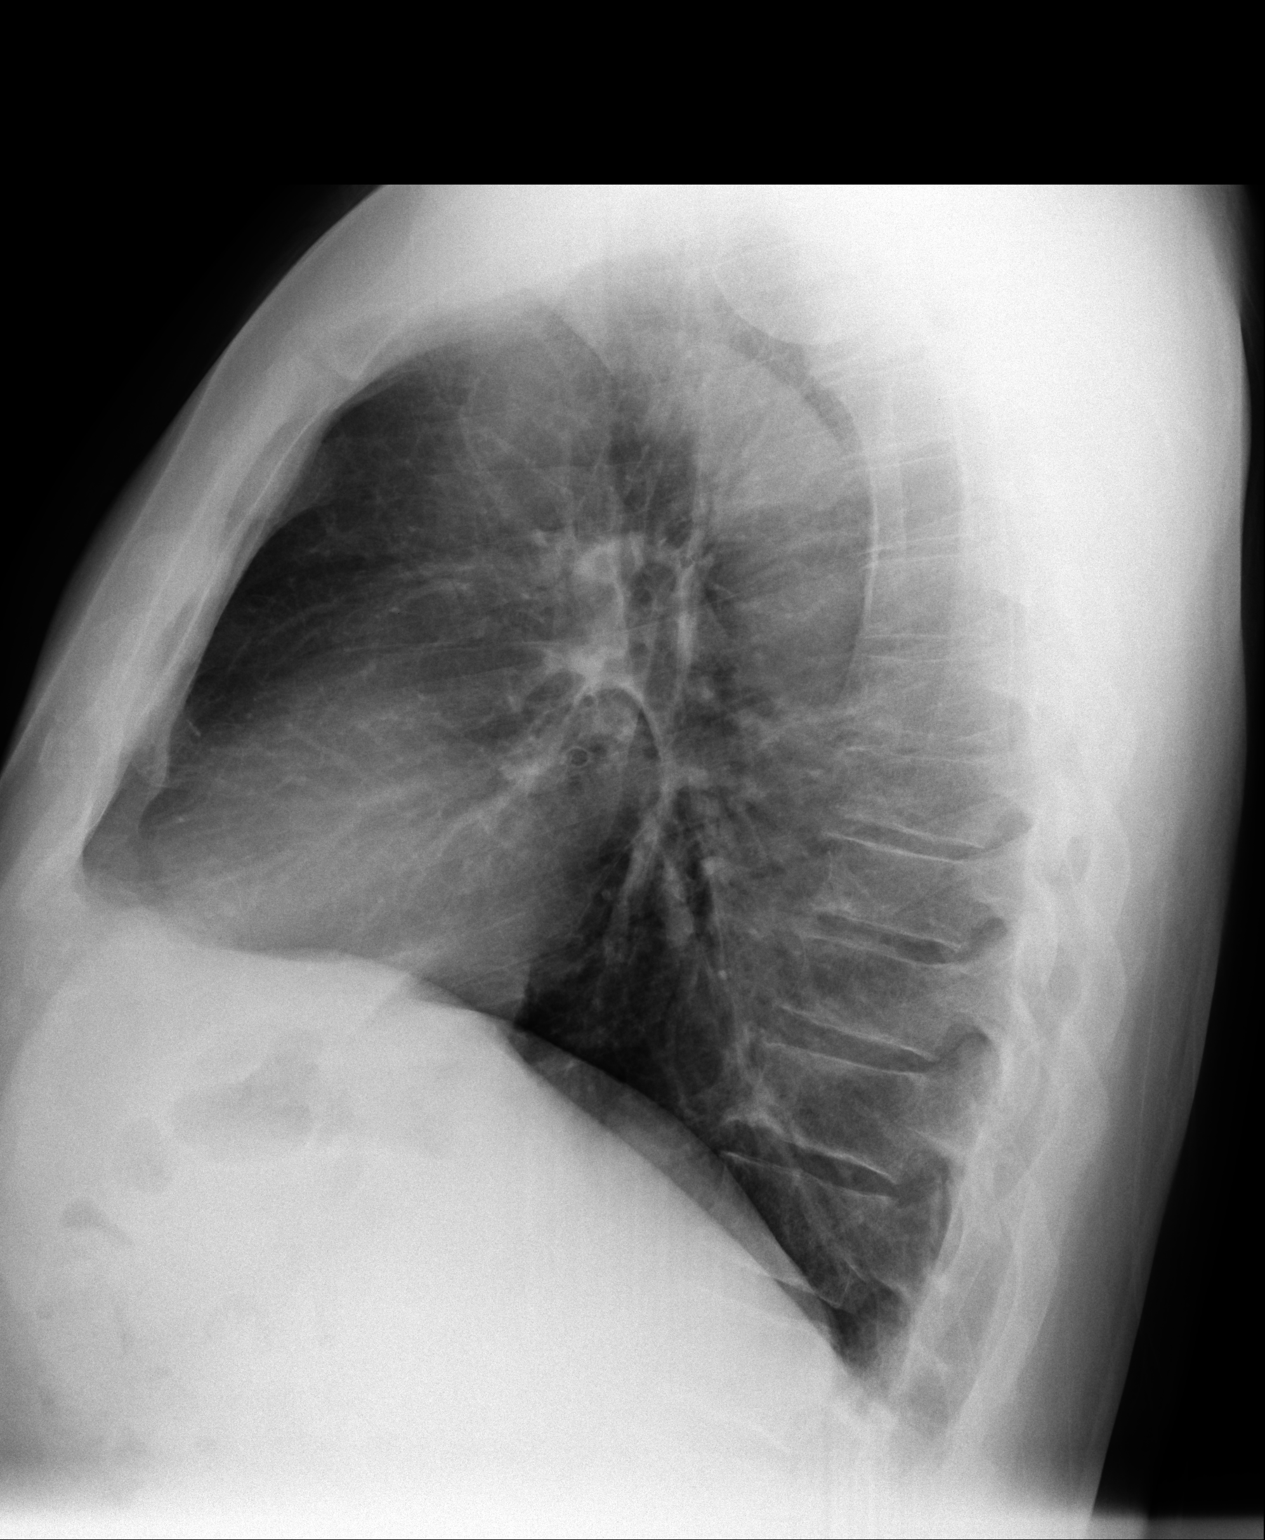

[3 of 3 positions shown; findings below may reference images not displayed]

IMPRESSION: 1. Chest radiograph without evidence of acute cardiopulmonary disease.

## 2013-11-02 ENCOUNTER — Ambulatory Visit: Payer: Self-pay | Admitting: Podiatry

## 2013-11-05 ENCOUNTER — Ambulatory Visit (INDEPENDENT_AMBULATORY_CARE_PROVIDER_SITE_OTHER): Payer: BLUE CROSS/BLUE SHIELD | Admitting: Podiatry

## 2013-11-05 ENCOUNTER — Encounter: Payer: Self-pay | Admitting: Podiatry

## 2013-11-05 ENCOUNTER — Ambulatory Visit (INDEPENDENT_AMBULATORY_CARE_PROVIDER_SITE_OTHER): Payer: BLUE CROSS/BLUE SHIELD

## 2013-11-05 VITALS — BP 113/79 | HR 71 | Resp 16 | Ht 71.0 in | Wt 220.0 lb

## 2013-11-05 DIAGNOSIS — M79609 Pain in unspecified limb: Secondary | ICD-10-CM

## 2013-11-05 DIAGNOSIS — M722 Plantar fascial fibromatosis: Secondary | ICD-10-CM

## 2013-11-05 DIAGNOSIS — M79673 Pain in unspecified foot: Secondary | ICD-10-CM

## 2013-11-05 MED ORDER — METHYLPREDNISOLONE (PAK) 4 MG PO TABS: ORAL_TABLET | ORAL | Status: DC

## 2013-11-05 MED ORDER — MELOXICAM 15 MG PO TABS: 15.0000 mg | ORAL_TABLET | Freq: Every day | ORAL | Status: DC

## 2013-11-05 NOTE — Progress Notes (Signed)
   Subjective:    Patient ID: Jorge Obrien, male    DOB: 11-Feb-1956, 58 y.o.   MRN: [id number]  HPI Comments: i have heel pian in both of my heels. i have had it 4-5weeks. It came up all of the sudden. It has gotten better for the last few days. It hurts bad when im up on my feet for a while. i have tried ice, ibuprofen and some exercising and i have orthotics and ive got new shoes. i bought a sleeping boot but im not wearing it.  Foot Pain      Review of Systems  Constitutional: Negative.   HENT: Negative.   Eyes: Negative.   Respiratory: Negative.   Cardiovascular: Negative.   Gastrointestinal: Negative.   Endocrine: Negative.   Genitourinary: Negative.   Musculoskeletal:       Difficulty walking Muscle pain  Skin: Negative.   Allergic/Immunologic: Negative.   Neurological: Negative.   Hematological: Negative.   Psychiatric/Behavioral: Negative.        Objective:   Physical Exam: I have reviewed his past medical history medications allergies surgeries social history and review of systems. Vital signs are stable he is alert and oriented x3. Pulses are strongly palpable bilateral +2/4 DP and PT bilateral. Capillary fill time to digits one through 5 of the bilateral foot is noted to be immediate. Neurologic sensorium is intact per since once the monofilament. Deep tendon reflexes are intact bilateral. Muscle strength is 5 over 5 dorsiflexors plantar flexors inverters everters all intrinsic musculature is intact. Orthopedic evaluation demonstrates all joints distal to the ankle a full range of motion without crepitation. He does have pain bilaterally symmetrical on palpation of the medial calcaneal tubercle. Radiographic evaluation does demonstrate a rectus foot bilateral a soft tissue increase in density at the plantar fascial calcaneal insertion site. After thorough discussion we have decided that more than likely this since this began simultaneously it is as a result of  fluoroquinolone use and is inflammatory in nature.        Assessment & Plan:  Assessment: Plantar fasciitis bilateral possibly associated with fluoroquinolone use.  Plan: Discussed etiology pathology conservative versus surgical therapies. We injected the bilateral heels today with Kenalog and local anesthetic to the point of maximal tenderness. Plantar fascial brace as were dispensed bilaterally. Night splints were dispensed bilateral. Prescriptions were provided for Medrol Dosepak to be followed by [facility]. We discussed appropriate shoe gear stretching exercises ice therapy shoe gear modifications. I will followup with him in one month, he will call with questions or concerns.

## 2013-11-05 NOTE — Patient Instructions (Signed)
Plantar Fasciitis (Heel Spur Syndrome) with Rehab The plantar fascia is a fibrous, ligament-like, soft-tissue structure that spans the bottom of the foot. Plantar fasciitis is a condition that causes pain in the foot due to inflammation of the tissue. SYMPTOMS   Pain and tenderness on the underneath side of the foot.  Pain that worsens with standing or walking. CAUSES  Plantar fasciitis is caused by irritation and injury to the plantar fascia on the underneath side of the foot. Common mechanisms of injury include:  Direct trauma to bottom of the foot.  Damage to a small nerve that runs under the foot where the main fascia attaches to the heel bone.  Stress placed on the plantar fascia due to bone spurs. RISK INCREASES WITH:   Activities that place stress on the plantar fascia (running, jumping, pivoting, or cutting).  Poor strength and flexibility.  Improperly fitted shoes.  Tight calf muscles.  Flat feet.  Failure to warm-up properly before activity.  Obesity. PREVENTION  Warm up and stretch properly before activity.  Allow for adequate recovery between workouts.  Maintain physical fitness:  Strength, flexibility, and endurance.  Cardiovascular fitness.  Maintain a health body weight.  Avoid stress on the plantar fascia.  Wear properly fitted shoes, including arch supports for individuals who have flat feet. PROGNOSIS  If treated properly, then the symptoms of plantar fasciitis usually resolve without surgery. However, occasionally surgery is necessary. RELATED COMPLICATIONS   Recurrent symptoms that may result in a chronic condition.  Problems of the lower back that are caused by compensating for the injury, such as limping.  Pain or weakness of the foot during push-off following surgery.  Chronic inflammation, scarring, and partial or complete fascia tear, occurring more often from repeated injections. TREATMENT  Treatment initially involves the use of  ice and medication to help reduce pain and inflammation. The use of strengthening and stretching exercises may help reduce pain with activity, especially stretches of the Achilles tendon. These exercises may be performed at home or with a therapist. Your caregiver may recommend that you use heel cups of arch supports to help reduce stress on the plantar fascia. Occasionally, corticosteroid injections are given to reduce inflammation. If symptoms persist for greater than 6 months despite non-surgical (conservative), then surgery may be recommended.  MEDICATION   If pain medication is necessary, then nonsteroidal anti-inflammatory medications, such as aspirin and ibuprofen, or other minor pain relievers, such as acetaminophen, are often recommended.  Do not take pain medication within 7 days before surgery.  Prescription pain relievers may be given if deemed necessary by your caregiver. Use only as directed and only as much as you need.  Corticosteroid injections may be given by your caregiver. These injections should be reserved for the most serious cases, because they may only be given a certain number of times. HEAT AND COLD  Cold treatment (icing) relieves pain and reduces inflammation. Cold treatment should be applied for 10 to 15 minutes every 2 to 3 hours for inflammation and pain and immediately after any activity that aggravates your symptoms. Use ice packs or massage the area with a piece of ice (ice massage).  Heat treatment may be used prior to performing the stretching and strengthening activities prescribed by your caregiver, physical therapist, or athletic trainer. Use a heat pack or soak the injury in warm water. SEEK IMMEDIATE MEDICAL CARE IF:  Treatment seems to offer no benefit, or the condition worsens.  Any medications produce adverse side effects. EXERCISES RANGE   OF MOTION (ROM) AND STRETCHING EXERCISES - Plantar Fasciitis (Heel Spur Syndrome) These exercises may help you  when beginning to rehabilitate your injury. Your symptoms may resolve with or without further involvement from your physician, physical therapist or athletic trainer. While completing these exercises, remember:   Restoring tissue flexibility helps normal motion to return to the joints. This allows healthier, less painful movement and activity.  An effective stretch should be held for at least 30 seconds.  A stretch should never be painful. You should only feel a gentle lengthening or release in the stretched tissue. RANGE OF MOTION - Toe Extension, Flexion  Sit with your right / left leg crossed over your opposite knee.  Grasp your toes and gently pull them back toward the top of your foot. You should feel a stretch on the bottom of your toes and/or foot.  Hold this stretch for __________ seconds.  Now, gently pull your toes toward the bottom of your foot. You should feel a stretch on the top of your toes and or foot.  Hold this stretch for __________ seconds. Repeat __________ times. Complete this stretch __________ times per day.  RANGE OF MOTION - Ankle Dorsiflexion, Active Assisted  Remove shoes and sit on a chair that is preferably not on a carpeted surface.  Place right / left foot under knee. Extend your opposite leg for support.  Keeping your heel down, slide your right / left foot back toward the chair until you feel a stretch at your ankle or calf. If you do not feel a stretch, slide your bottom forward to the edge of the chair, while still keeping your heel down.  Hold this stretch for __________ seconds. Repeat __________ times. Complete this stretch __________ times per day.  STRETCH  Gastroc, Standing  Place hands on wall.  Extend right / left leg, keeping the front knee somewhat bent.  Slightly point your toes inward on your back foot.  Keeping your right / left heel on the floor and your knee straight, shift your weight toward the wall, not allowing your back to  arch.  You should feel a gentle stretch in the right / left calf. Hold this position for __________ seconds. Repeat __________ times. Complete this stretch __________ times per day. STRETCH  Soleus, Standing  Place hands on wall.  Extend right / left leg, keeping the other knee somewhat bent.  Slightly point your toes inward on your back foot.  Keep your right / left heel on the floor, bend your back knee, and slightly shift your weight over the back leg so that you feel a gentle stretch deep in your back calf.  Hold this position for __________ seconds. Repeat __________ times. Complete this stretch __________ times per day. STRETCH  Gastrocsoleus, Standing  Note: This exercise can place a lot of stress on your foot and ankle. Please complete this exercise only if specifically instructed by your caregiver.   Place the ball of your right / left foot on a step, keeping your other foot firmly on the same step.  Hold on to the wall or a rail for balance.  Slowly lift your other foot, allowing your body weight to press your heel down over the edge of the step.  You should feel a stretch in your right / left calf.  Hold this position for __________ seconds.  Repeat this exercise with a slight bend in your right / left knee. Repeat __________ times. Complete this stretch __________ times per day.    STRENGTHENING EXERCISES - Plantar Fasciitis (Heel Spur Syndrome)  These exercises may help you when beginning to rehabilitate your injury. They may resolve your symptoms with or without further involvement from your physician, physical therapist or athletic trainer. While completing these exercises, remember:   Muscles can gain both the endurance and the strength needed for everyday activities through controlled exercises.  Complete these exercises as instructed by your physician, physical therapist or athletic trainer. Progress the resistance and repetitions only as guided. STRENGTH - Towel  Curls  Sit in a chair positioned on a non-carpeted surface.  Place your foot on a towel, keeping your heel on the floor.  Pull the towel toward your heel by only curling your toes. Keep your heel on the floor.  If instructed by your physician, physical therapist or athletic trainer, add ____________________ at the end of the towel. Repeat __________ times. Complete this exercise __________ times per day. STRENGTH - Ankle Inversion  Secure one end of a rubber exercise band/tubing to a fixed object (table, pole). Loop the other end around your foot just before your toes.  Place your fists between your knees. This will focus your strengthening at your ankle.  Slowly, pull your big toe up and in, making sure the band/tubing is positioned to resist the entire motion.  Hold this position for __________ seconds.  Have your muscles resist the band/tubing as it slowly pulls your foot back to the starting position. Repeat __________ times. Complete this exercises __________ times per day.  Document Released: 08/13/2005 Document Revised: 11/05/2011 Document Reviewed: 11/25/2008 [employer] Patient Information 2014 [employer]. Plantar Fasciitis Plantar fasciitis is a common condition that causes foot pain. It is soreness (inflammation) of the band of tough fibrous tissue on the bottom of the foot that runs from the heel bone (calcaneus) to the ball of the foot. The cause of this soreness may be from excessive standing, poor fitting shoes, running on hard surfaces, being overweight, having an abnormal walk, or overuse (this is common in runners) of the painful foot or feet. It is also common in aerobic exercise dancers and ballet dancers. SYMPTOMS  Most people with plantar fasciitis complain of:  Severe pain in the morning on the bottom of their foot especially when taking the first steps out of bed. This pain recedes after a few minutes of walking.  Severe pain is experienced also during walking  following a long period of inactivity.  Pain is worse when walking barefoot or up stairs DIAGNOSIS   Your caregiver will diagnose this condition by examining and feeling your foot.  Special tests such as X-rays of your foot, are usually not needed. PREVENTION   Consult a sports medicine professional before beginning a new exercise program.  Walking programs offer a good workout. With walking there is a lower chance of overuse injuries common to runners. There is less impact and less jarring of the joints.  Begin all new exercise programs slowly. If problems or pain develop, decrease the amount of time or distance until you are at a comfortable level.  Wear good shoes and replace them regularly.  Stretch your foot and the heel cords at the back of the ankle (Achilles tendon) both before and after exercise.  Run or exercise on even surfaces that are not hard. For example, asphalt is better than pavement.  Do not run barefoot on hard surfaces.  If using a treadmill, vary the incline.  Do not continue to workout if you have foot or joint   problems. Seek professional help if they do not improve. HOME CARE INSTRUCTIONS   Avoid activities that cause you pain until you recover.  Use ice or cold packs on the problem or painful areas after working out.  Only take over-the-counter or prescription medicines for pain, discomfort, or fever as directed by your caregiver.  Soft shoe inserts or athletic shoes with air or gel sole cushions may be helpful.  If problems continue or become more severe, consult a sports medicine caregiver or your own health care provider. Cortisone is a potent anti-inflammatory medication that may be injected into the painful area. You can discuss this treatment with your caregiver. MAKE SURE YOU:   Understand these instructions.  Will watch your condition.  Will get help right away if you are not doing well or get worse. Document Released: 05/08/2001 Document  Revised: 11/05/2011 Document Reviewed: 07/07/2008 [employer] Patient Information 2014 [employer].  

## 2014-01-01 ENCOUNTER — Other Ambulatory Visit: Payer: Self-pay | Admitting: Family Medicine

## 2014-01-01 NOTE — Telephone Encounter (Signed)
Pt left v/m requesting status of alprazolam refill to [facility].

## 2014-01-01 NOTE — Telephone Encounter (Signed)
Electronic refill request. Last Filled:   30 tablet 0RF on 07/02/2012.

## 2014-01-03 NOTE — Telephone Encounter (Signed)
Declined.  He hasn't been seen in about 1.5 years.  Needs OV.

## 2014-01-04 NOTE — Telephone Encounter (Signed)
Pharmacy aware

## 2014-01-15 ENCOUNTER — Ambulatory Visit: Payer: BC Managed Care – PPO | Admitting: Family Medicine

## 2017-06-03 ENCOUNTER — Other Ambulatory Visit: Payer: Self-pay | Admitting: Internal Medicine

## 2017-06-03 DIAGNOSIS — R1084 Generalized abdominal pain: Secondary | ICD-10-CM

## 2017-06-06 ENCOUNTER — Other Ambulatory Visit: Payer: Self-pay | Admitting: Internal Medicine

## 2017-06-06 DIAGNOSIS — E785 Hyperlipidemia, unspecified: Secondary | ICD-10-CM

## 2017-10-22 ENCOUNTER — Encounter: Payer: Self-pay | Admitting: General Surgery

## 2020-05-25 ENCOUNTER — Emergency Department
Admission: EM | Admit: 2020-05-25 | Discharge: 2020-05-25 | Disposition: A | Discharge status: Home / Self Care | Payer: BC Managed Care – PPO | Attending: Emergency Medicine | Admitting: Emergency Medicine

## 2020-05-25 ENCOUNTER — Encounter: Payer: Self-pay | Admitting: Emergency Medicine

## 2020-05-25 ENCOUNTER — Emergency Department: Payer: BC Managed Care – PPO

## 2020-05-25 ENCOUNTER — Other Ambulatory Visit: Payer: Self-pay

## 2020-05-25 DIAGNOSIS — I471 Supraventricular tachycardia: Secondary | ICD-10-CM | POA: Insufficient documentation

## 2020-05-25 DIAGNOSIS — R0602 Shortness of breath: Secondary | ICD-10-CM | POA: Insufficient documentation

## 2020-05-25 LAB — BASIC METABOLIC PANEL
Anion gap: 11 (ref 5–15)
BUN: 9 mg/dL (ref 8–23)
CO2: 26 mmol/L (ref 22–32)
Calcium: 9.1 mg/dL (ref 8.9–10.3)
Chloride: 102 mmol/L (ref 98–111)
Creatinine, Ser: 0.95 mg/dL (ref 0.61–1.24)
GFR calc Af Amer: >60 mL/min (ref >60)
GFR calc non Af Amer: >60 mL/min (ref >60)
Glucose, Bld: 110 mg/dL — ABNORMAL HIGH (ref 70–99)
Potassium: 3.9 mmol/L (ref 3.5–5.1)
Sodium: 139 mmol/L (ref 135–145)

## 2020-05-25 LAB — CBC
HCT: 45.7 % (ref 39.0–52.0)
Hemoglobin: 16.1 g/dL (ref 13.0–17.0)
MCH: 32.1 pg (ref 26.0–34.0)
MCHC: 35.2 g/dL (ref 30.0–36.0)
MCV: 91 fL (ref 80.0–100.0)
Platelets: 345 10*3/uL (ref 150–400)
RBC: 5.02 MIL/uL (ref 4.22–5.81)
RDW: 11.9 % (ref 11.5–15.5)
WBC: 10.8 10*3/uL — ABNORMAL HIGH (ref 4.0–10.5)
nRBC: 0 % (ref 0.0–0.2)

## 2020-05-25 LAB — TROPONIN I (HIGH SENSITIVITY): Troponin I (High Sensitivity): 14 ng/L (ref <18)

## 2020-05-25 MED ORDER — ADENOSINE 6 MG/2ML IV SOLN
6.0000 mg | Freq: Once | INTRAVENOUS | Status: AC
  Administered 2020-05-25: 6 mg via INTRAVENOUS

## 2020-05-25 MED ORDER — SODIUM CHLORIDE 0.9 % IV BOLUS
1000.0000 mL | Freq: Once | INTRAVENOUS | Status: AC
  Administered 2020-05-25: 1000 mL via INTRAVENOUS

## 2020-05-25 NOTE — ED Triage Notes (Signed)
Pt presents to ED via POV with C/o SVT, states took first dose of Metoprolol this morning, had recent cardioversion for same. Pt A&O x4, ambulatory to room 5 from triage.

## 2020-05-25 NOTE — ED Notes (Signed)
Pt ambulated out of ED without difficulty.

## 2020-05-25 NOTE — ED Notes (Signed)
6mg  Adenosine given by Nira Conn, RN

## 2020-05-25 NOTE — ED Notes (Signed)
EDP at bedside to assess patient, attempt vagal maneuvers at this time.

## 2020-05-25 NOTE — ED Provider Notes (Signed)
[facility] Emergency Department Provider Note  Time seen: 9:48 AM  I have reviewed the triage vital signs and the nursing notes.   HISTORY  Chief Complaint SVT   HPI Jorge Obrien is a 64 y.o. male with a past medical history of anxiety, pneumonia, hyperlipidemia, presents to the emergency department for rapid heart rate.  According to the patient approximately 1 year ago he had an episode of SVT, had not had any other episodes until approximately 1 week ago when the patient went to [facility] [place] for rapid heart rate and shortness of breath.  Patient had a chemical cardioversion performed and was started on Toprol.  Patient took his first dose of Toprol this morning however while getting ready this morning patient began to feel rapid heart rate and short of breath again.  Attempted to do vagal maneuvers without success so he came to the emergency department.  Upon arrival to the emergency department patient found to be in SVT around 200 bpm.  Patient denies any chest pain does state slight shortness of breath.  No fever.  Patient did recently recover from Covid approximately 2 weeks ago per patient.   Past Medical History:  Diagnosis Date  . Anxiety 02/2006  . Anxiety and depression 05/2006   [facility]  . 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 Hyperlipidemia 09/2006  . Pneumonia 05/2006   [facility]  . Seizures ([place]) 2012   History of- nightime and was on Klonopin/Dilantin.  Eval at [facility]. Eval by Dr. Erling Cruz    Patient Active Problem List   Diagnosis Date Noted  . Atypical chest pain 07/12/2012  . OSA (obstructive sleep apnea) 07/12/2012  . SEIZURE DISORDER 09/03/2010  . BACK PAIN 09/01/2010  . HYPERTROPHY PROSTATE W/UR OBST & OTH LUTS 11/09/2008  . HYPERGLYCEMIA 06/11/2008  . HYPERLIPIDEMIA 11/27/2006  . ANXIETY 11/27/2006    Past Surgical History:  Procedure Laterality Date  . TONSILLECTOMY     As a child.    Prior to Admission  medications   Medication Sig Start Date End Date Taking? Authorizing Provider  ALPRAZolam Duanne Moron) 0.5 MG tablet Take 1 tablet (0.5 mg total) by mouth 2 (two) times daily as needed. 07/02/12   Tonia Ghent, MD  ibuprofen (ADVIL,MOTRIN) 200 MG tablet 200 mg. Take 3 tablets every 12 hrs as needed for pain    [provider]  meloxicam (MOBIC) 15 MG tablet Take 1 tablet (15 mg total) by mouth daily. 11/05/13   Hyatt, Max T, DPM  methylPREDNIsolone (MEDROL DOSPACK) 4 MG tablet follow package directions 11/05/13   Tyson Dense T, DPM    Allergies  Allergen Reactions  . Omnicef [Cefdinir] Rash    Family History  Problem Relation Age of Onset  . Cataracts Mother   . Hypertension Father   . Cancer Father        Brain glioblastoma  . Cancer Brother        Testicular CA, pituitary adenoma  . Hypertension Brother   . Alcohol abuse Maternal Uncle   . Heart disease Neg Hx   . Diabetes Neg Hx   . Stroke Neg Hx   . Cancer Brother        Melanoma  . Seizures Other     Social History Social History   Tobacco Use  . Smoking status: Never Smoker  Substance Use Topics  . Alcohol use: Yes    Comment: occasionally  . Drug use: No  Review of Systems Constitutional: Negative for fever. Cardiovascular: Negative for chest pain.  Positive for rapid heart rate Respiratory: Slight shortness of breath Gastrointestinal: Negative for abdominal pain, vomiting and diarrhea. Musculoskeletal: Negative for musculoskeletal complaints Neurological: Negative for headache All other ROS negative  ____________________________________________   PHYSICAL EXAM:  VITAL SIGNS: ED Triage Vitals  Enc Vitals Group     BP 05/25/20 0941 (!) 177/115     Pulse Rate 05/25/20 0941 (!) 108     Resp 05/25/20 0941 (!) 32     Temp --      Temp src --      SpO2 05/25/20 0941 96 %     Weight 05/25/20 0939 210 lb (95.3 kg)     Height 05/25/20 0939 5\' 11"  (1.803 m)     Head Circumference --      Peak  Flow --      Pain Score 05/25/20 0939 0     Pain Loc --      Pain Edu? --      Excl. in [place]? --    Constitutional: Alert and oriented. Well appearing and in no distress. Eyes: Normal exam ENT      Head: Normocephalic and atraumatic.      Mouth/Throat: Mucous membranes are moist. Cardiovascular: Regular rhythm rate around 200 bpm. Respiratory: Normal respiratory effort without tachypnea nor retractions. Breath sounds are clear  Gastrointestinal: Soft and nontender. No distention.   Musculoskeletal: Nontender with normal range of motion in all extremities.  Neurologic:  Normal speech and language. No gross focal neurologic deficits Skin:  Skin is warm, dry and intact.  Psychiatric: Mood and affect are normal.   ____________________________________________    EKG  EKG viewed and interpreted by myself shows supraventricular tachycardia 190 bpm with a narrow QRS, normal axis, normal intervals, nonspecific ST changes most consistent with demand ischemia.  No ST elevation.  Repeat EKG viewed and interpreted by myself shows a sinus tachycardia 108 bpm with a narrow QRS, normal axis, normal intervals, nonspecific ST changes.  ____________________________________________    INITIAL IMPRESSION / ASSESSMENT AND PLAN / ED COURSE  Pertinent labs & imaging results that were available during my care of the patient were reviewed by me and considered in my medical decision making (see chart for details).   Patient presents to the emergency department for rapid heart rate mild shortness of breath found to be in SVT around 200 bpm.  Overall patient appears well, is somewhat anxious.  Attempted vagal maneuvers in the bed without success.  Patient dosed 6 mg of adenosine with good response and conversion back to a sinus rhythm.  I spoke with Dr. Nehemiah Massed who would be happy to see the patient in follow-up.  Patient's labs are reassuring including negative troponin.  Remains in normal sinus rhythm  around 60 bpm.  We will discharge home with cardiology follow-up.  Discussed return precautions as well as vagal maneuvers.  Denzil Magnuson was evaluated in Emergency Department on 05/25/2020 for the symptoms described in the history of present illness. He was evaluated in the context of the global COVID-19 pandemic, which necessitated consideration that the patient might be at risk for infection with the SARS-CoV-2 virus that causes COVID-19. Institutional protocols and algorithms that pertain to the evaluation of patients at risk for COVID-19 are in a state of rapid change based on information released by regulatory bodies including the CDC and federal and state organizations. These policies and algorithms were followed during the patient's care  in the ED.  ____________________________________________   FINAL CLINICAL IMPRESSION(S) / ED DIAGNOSES  Supraventricular tachycardia   Harvest Dark, MD 05/25/20 1115

## 2020-10-09 ENCOUNTER — Emergency Department
Admission: EM | Admit: 2020-10-09 | Discharge: 2020-10-09 | Disposition: A | Discharge status: Home / Self Care | Payer: BC Managed Care – PPO | Attending: Emergency Medicine | Admitting: Emergency Medicine

## 2020-10-09 ENCOUNTER — Other Ambulatory Visit: Payer: Self-pay

## 2020-10-09 DIAGNOSIS — Z79899 Other long term (current) drug therapy: Secondary | ICD-10-CM | POA: Diagnosis not present

## 2020-10-09 DIAGNOSIS — I471 Supraventricular tachycardia: Secondary | ICD-10-CM | POA: Insufficient documentation

## 2020-10-09 DIAGNOSIS — R000 Tachycardia, unspecified: Secondary | ICD-10-CM | POA: Diagnosis present

## 2020-10-09 LAB — COMPREHENSIVE METABOLIC PANEL
ALT: 41 U/L (ref 0–44)
AST: 34 U/L (ref 15–41)
Albumin: 4.3 g/dL (ref 3.5–5.0)
Alkaline Phosphatase: 66 U/L (ref 38–126)
Anion gap: 11 (ref 5–15)
BUN: 11 mg/dL (ref 8–23)
CO2: 23 mmol/L (ref 22–32)
Calcium: 8.7 mg/dL — ABNORMAL LOW (ref 8.9–10.3)
Chloride: 103 mmol/L (ref 98–111)
Creatinine, Ser: 1.06 mg/dL (ref 0.61–1.24)
GFR, Estimated: >60 mL/min (ref >60)
Glucose, Bld: 101 mg/dL — ABNORMAL HIGH (ref 70–99)
Potassium: 3.3 mmol/L — ABNORMAL LOW (ref 3.5–5.1)
Sodium: 137 mmol/L (ref 135–145)
Total Bilirubin: 1 mg/dL (ref 0.3–1.2)
Total Protein: 6.9 g/dL (ref 6.5–8.1)

## 2020-10-09 LAB — CBC
HCT: 46.7 % (ref 39.0–52.0)
Hemoglobin: 16.4 g/dL (ref 13.0–17.0)
MCH: 32.7 pg (ref 26.0–34.0)
MCHC: 35.1 g/dL (ref 30.0–36.0)
MCV: 93.2 fL (ref 80.0–100.0)
Platelets: 191 10*3/uL (ref 150–400)
RBC: 5.01 MIL/uL (ref 4.22–5.81)
RDW: 11.6 % (ref 11.5–15.5)
WBC: 7.9 10*3/uL (ref 4.0–10.5)
nRBC: 0 % (ref 0.0–0.2)

## 2020-10-09 MED ORDER — ALPRAZOLAM 0.5 MG PO TABS: 0.5000 mg | ORAL_TABLET | Freq: Every day | ORAL | 0 refills | Status: AC | PRN

## 2020-10-09 MED ORDER — DILTIAZEM HCL 25 MG/5ML IV SOLN
INTRAVENOUS | Status: AC
  Administered 2020-10-09: 20 mg via INTRAVENOUS
  Filled 2020-10-09: qty 5

## 2020-10-09 MED ORDER — DILTIAZEM HCL 25 MG/5ML IV SOLN: 20.0000 mg | Freq: Once | INTRAVENOUS | Status: AC

## 2020-10-09 MED ORDER — DILTIAZEM HCL 25 MG/5ML IV SOLN: 10.0000 mg | Freq: Once | INTRAVENOUS | Status: DC

## 2020-10-09 NOTE — ED Triage Notes (Signed)
Pt arrives EMS with SVT. Pt does have hx of SVT with Adenosine being the effective treatment in the past. EMS reports HR in the 160s. Pt reports dizziness but denies SOB. Pt is hypertensive at this time. EMs started a 20 g in the left [facility] and gave a 500 cc bolus of NS. Pt appears anxious on arrival.

## 2020-10-09 NOTE — ED Provider Notes (Signed)
[facility] Emergency Department Provider Note  Time seen: 8:31 PM  I have reviewed the triage vital signs and the nursing notes.   HISTORY  Chief Complaint Tachycardia   HPI Jorge Obrien is a 65 y.o. male with a past medical history of anxiety, SVT, presents to the emergency department for rapid heart rate.  According to the patient approximately 30 minutes prior to arrival he felt his heart rate take off he began feeling somewhat dizzy.  Patient states he has had SVT 2 prior times over the past 4 months.  Patient states he felt his heart rate increased tonight after getting somewhat anxious/upset.  Patient denies any chest pain at any point.  EMS tried adenosine in route with no response.  Upon arrival patient denies any chest pain or shortness of breath but is aware of his heart beating very fast.  Vital signs are otherwise reassuring including hypertensive blood pressure.   Past Medical History:  Diagnosis Date  . Anxiety 02/2006  . Anxiety and depression 05/2006   [facility]  . 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 Hyperlipidemia 09/2006  . Pneumonia 05/2006   [facility]  . Seizures ([place]) 2012   History of- nightime and was on Klonopin/Dilantin.  Eval at [facility]. Eval by Dr. Erling Cruz    Patient Active Problem List   Diagnosis Date Noted  . Atypical chest pain 07/12/2012  . OSA (obstructive sleep apnea) 07/12/2012  . SEIZURE DISORDER 09/03/2010  . BACK PAIN 09/01/2010  . HYPERTROPHY PROSTATE W/UR OBST & OTH LUTS 11/09/2008  . HYPERGLYCEMIA 06/11/2008  . HYPERLIPIDEMIA 11/27/2006  . ANXIETY 11/27/2006    Past Surgical History:  Procedure Laterality Date  . TONSILLECTOMY     As a child.    Prior to Admission medications   Medication Sig Start Date End Date Taking? Authorizing Provider  ALPRAZolam Duanne Moron) 0.5 MG tablet Take 1 tablet (0.5 mg total) by mouth 2 (two) times daily as needed. 07/02/12   Tonia Ghent, MD  ibuprofen (ADVIL,MOTRIN)  200 MG tablet 200 mg. Take 3 tablets every 12 hrs as needed for pain    [provider]  meloxicam (MOBIC) 15 MG tablet Take 1 tablet (15 mg total) by mouth daily. 11/05/13   Hyatt, Max T, DPM  methylPREDNIsolone (MEDROL DOSPACK) 4 MG tablet follow package directions 11/05/13   Tyson Dense T, DPM    Allergies  Allergen Reactions  . Omnicef [Cefdinir] Rash    Family History  Problem Relation Age of Onset  . Cataracts Mother   . Hypertension Father   . Cancer Father        Brain glioblastoma  . Cancer Brother        Testicular CA, pituitary adenoma  . Hypertension Brother   . Alcohol abuse Maternal Uncle   . Heart disease Neg Hx   . Diabetes Neg Hx   . Stroke Neg Hx   . Cancer Brother        Melanoma  . Seizures Other     Social History Social History   Tobacco Use  . Smoking status: Never Smoker  Substance Use Topics  . Alcohol use: Yes    Comment: occasionally  . Drug use: No    Review of Systems Constitutional: Negative for fever. Cardiovascular: Negative for chest pain.  Rapid heart rate Respiratory: Negative for shortness of breath. Gastrointestinal: Negative for abdominal pain Musculoskeletal: Negative for musculoskeletal complaints Neurological: Negative for headache All other ROS negative  ____________________________________________   PHYSICAL EXAM:  VITAL SIGNS: ED Triage Vitals  Enc Vitals Group     BP 10/09/20 2013 (!) 173/125     Pulse Rate 10/09/20 2013 (!) 152     Resp 10/09/20 2013 16     Temp 10/09/20 2013 97.7 F (36.5 C)     Temp Source 10/09/20 2013 Oral     SpO2 10/09/20 2013 95 %     Weight 10/09/20 2017 213 lb (96.6 kg)     Height 10/09/20 2017 5\' 11"  (1.803 m)     Head Circumference --      Peak Flow --      Pain Score 10/09/20 2016 0     Pain Loc --      Pain Edu? --      Excl. in [place]? --     Constitutional: Alert and oriented. Well appearing and in no distress. Eyes: Normal exam ENT      Head: Normocephalic and  atraumatic.      Mouth/Throat: Mucous membranes are moist. Cardiovascular: (After conversion) regular rate and rhythm around 70 bpm. Respiratory: Normal respiratory effort without tachypnea nor retractions. Breath sounds are clear Gastrointestinal: Soft and nontender. No distention. Musculoskeletal: Nontender with normal range of motion in all extremities. No lower extremity tenderness Neurologic:  Normal speech and language. No gross focal neurologic deficits  Skin:  Skin is warm, dry and intact.  Psychiatric: Mood and affect are normal.   ____________________________________________    EKG  EKG viewed and interpreted by myself shows a tachycardic rhythm at 152 bpm with a narrow QRS, normal axis, largely normal intervals.  Rhythm most consistent with SVT versus atrial flutter with 2-1 block.  Repeat EKG viewed and interpreted by myself shows a normal sinus rhythm 84 bpm with a narrow QRS, normal axis, normal intervals, no concerning ST changes.  ____________________________________________   INITIAL IMPRESSION / ASSESSMENT AND PLAN / ED COURSE  Pertinent labs & imaging results that were available during my care of the patient were reviewed by me and considered in my medical decision making (see chart for details).   Patient presents emergency department for rapid heart rate which occurred approximate 30 minutes prior to arrival.  Overall the patient appears well, no distress.  As the patient had adenosine by EMS with failure to convert I dosed 20 mg of diltiazem in the emergency department.  EKG most consistent with SVT versus atrial flutter with 2-1 block.  Within 1 minute after diltiazem injection patient has converted back to normal sinus rhythm and continues to be in a normal sinus rhythm.  Suspect likely SVT.  This is the patient's third episode in the past 4 months.  Patient has not yet followed up with a cardiologist but his PCP is put him on metoprolol twice daily.  Patient admits  that he is only been taking it once daily.  Did take metoprolol just before leaving home with EMS tonight.  Currently patient appears well, denies any symptoms.  Patient states this typically happens when he gets upset or very anxious/nervous.  He is asking for a short course of an anxiety medication.  I believe this is reasonable we will provide a very short course the patient is to follow-up with his PCP regarding this.  Labs are largely within normal limits/baseline for the patient.  Has remained in normal sinus rhythm since conversion.  We will discharge patient home with cardiology follow-up.  Patient agreeable to plan of care.  Denzil Magnuson  was evaluated in Emergency Department on 10/09/2020 for the symptoms described in the history of present illness. He was evaluated in the context of the global COVID-19 pandemic, which necessitated consideration that the patient might be at risk for infection with the SARS-CoV-2 virus that causes COVID-19. Institutional protocols and algorithms that pertain to the evaluation of patients at risk for COVID-19 are in a state of rapid change based on information released by regulatory bodies including the CDC and federal and state organizations. These policies and algorithms were followed during the patient's care in the ED.  ____________________________________________   FINAL CLINICAL IMPRESSION(S) / ED DIAGNOSES  SVT   Harvest Dark, MD 10/09/20 2122

## 2020-11-08 ENCOUNTER — Emergency Department
Admission: EM | Admit: 2020-11-08 | Discharge: 2020-11-09 | Disposition: A | Discharge status: Home / Self Care | Payer: BC Managed Care – PPO | Attending: Emergency Medicine | Admitting: Emergency Medicine

## 2020-11-08 ENCOUNTER — Other Ambulatory Visit: Payer: Self-pay

## 2020-11-08 DIAGNOSIS — R002 Palpitations: Secondary | ICD-10-CM | POA: Diagnosis present

## 2020-11-08 DIAGNOSIS — I471 Supraventricular tachycardia: Secondary | ICD-10-CM

## 2020-11-08 LAB — CBC WITH DIFFERENTIAL/PLATELET
Abs Immature Granulocytes: 0.04 10*3/uL (ref 0.00–0.07)
Basophils Absolute: 0 10*3/uL (ref 0.0–0.1)
Basophils Relative: 1 %
Eosinophils Absolute: 0.1 10*3/uL (ref 0.0–0.5)
Eosinophils Relative: 2 %
HCT: 43.2 % (ref 39.0–52.0)
Hemoglobin: 15.5 g/dL (ref 13.0–17.0)
Immature Granulocytes: 1 %
Lymphocytes Relative: 27 %
Lymphs Abs: 1.9 10*3/uL (ref 0.7–4.0)
MCH: 32.3 pg (ref 26.0–34.0)
MCHC: 35.9 g/dL (ref 30.0–36.0)
MCV: 90 fL (ref 80.0–100.0)
Monocytes Absolute: 0.6 10*3/uL (ref 0.1–1.0)
Monocytes Relative: 8 %
Neutro Abs: 4.4 10*3/uL (ref 1.7–7.7)
Neutrophils Relative %: 61 %
Platelets: 189 10*3/uL (ref 150–400)
RBC: 4.8 MIL/uL (ref 4.22–5.81)
RDW: 11.6 % (ref 11.5–15.5)
WBC: 7.1 10*3/uL (ref 4.0–10.5)
nRBC: 0 % (ref 0.0–0.2)

## 2020-11-08 LAB — BASIC METABOLIC PANEL
Anion gap: 7 (ref 5–15)
BUN: 22 mg/dL (ref 8–23)
CO2: 24 mmol/L (ref 22–32)
Calcium: 9 mg/dL (ref 8.9–10.3)
Chloride: 105 mmol/L (ref 98–111)
Creatinine, Ser: 1.15 mg/dL (ref 0.61–1.24)
GFR, Estimated: >60 mL/min (ref >60)
Glucose, Bld: 104 mg/dL — ABNORMAL HIGH (ref 70–99)
Potassium: 3.6 mmol/L (ref 3.5–5.1)
Sodium: 136 mmol/L (ref 135–145)

## 2020-11-08 LAB — MAGNESIUM: Magnesium: 2.1 mg/dL (ref 1.7–2.4)

## 2020-11-08 NOTE — Discharge Instructions (Addendum)
Your labs today were reassuring.  Please continue your metoprolol as prescribed.  Please follow-up with cardiology.

## 2020-11-08 NOTE — ED Provider Notes (Addendum)
[facility] Emergency Department Provider Note  ____________________________________________   Event Date/Time   First MD Initiated Contact with Patient 11/08/20 2256     (approximate)  I have reviewed the triage vital signs and the nursing notes.   HISTORY  Chief Complaint Tachycardia    HPI Jorge Obrien is a 65 y.o. male with history of anxiety, hyperlipidemia who presents to the emergency department with EMS after an episode of SVT.  States tonight he was going to bed he felt like his heart was racing.  He had a similar episode in February and was started on metoprolol 25 mg twice daily.  He took his metoprolol about an hour prior to this episode.  He states he tried blowing into a syringe when he noticed that his heart rate was in the 200s.   When EMS arrived his HR was in the 160s.  When they hooked him up to the cardiac monitor, he was in NSR and rate had improved.  He states he has had another episode of this in October 2021.  He has not yet seen a cardiologist.  Reports 2-3 caffeinated beverages daily.  No other stimulants.  No illicit drug use.  Had normal TSH in November 2021 with his PCP Dr. Sabra Heck.  No recent fevers, cough, vomiting, diarrhea, bloody stools, melena.  No chest pain or shortness of breath.  No calf tenderness or calf swelling.  Feels back to his baseline at this time.  He states that he does monitor his heart rate at home and sometimes it will be as low as 50.  This was even before metoprolol was started.    I have reviewed patient's rhythm strips from EMS that showed normal sinus rhythm without ischemia, interval abnormality.    Past Medical History:  Diagnosis Date  . Anxiety 02/2006  . Anxiety and depression 05/2006   [facility]  . Electrolyte disorder 05/2006   [facility]  . Hyperlipidemia 09/2006  . Pneumonia 05/2006   [facility]  . Seizures ([place]) 2012   History of- nightime and was on Klonopin/Dilantin.  Eval at [facility]. Eval  by Dr. Erling Cruz    Patient Active Problem List   Diagnosis Date Noted  . Atypical chest pain 07/12/2012  . OSA (obstructive sleep apnea) 07/12/2012  . SEIZURE DISORDER 09/03/2010  . BACK PAIN 09/01/2010  . HYPERTROPHY PROSTATE W/UR OBST & OTH LUTS 11/09/2008  . HYPERGLYCEMIA 06/11/2008  . HYPERLIPIDEMIA 11/27/2006  . ANXIETY 11/27/2006    Past Surgical History:  Procedure Laterality Date  . TONSILLECTOMY     As a child.    Prior to Admission medications   Medication Sig Start Date End Date Taking? Authorizing Provider  metoprolol tartrate (LOPRESSOR) 25 MG tablet Take 1 tablet (25 mg total) by mouth 2 (two) times daily. 11/09/20 12/09/20 Yes Ghassan Coggeshall, Delice Bison, DO  ibuprofen (ADVIL,MOTRIN) 200 MG tablet 200 mg. Take 3 tablets every 12 hrs as needed for pain    [provider]  meloxicam (MOBIC) 15 MG tablet Take 1 tablet (15 mg total) by mouth daily. 11/05/13   Hyatt, Max T, DPM  methylPREDNIsolone (MEDROL DOSPACK) 4 MG tablet follow package directions 11/05/13   Hyatt, Max T, DPM    Allergies Omnicef [cefdinir]  Family History  Problem Relation Age of Onset  . Cataracts Mother   . Hypertension Father   . Cancer Father        Brain glioblastoma  . Cancer Brother  Testicular CA, pituitary adenoma  . Hypertension Brother   . Alcohol abuse Maternal Uncle   . Heart disease Neg Hx   . Diabetes Neg Hx   . Stroke Neg Hx   . Cancer Brother        Melanoma  . Seizures Other     Social History Social History   Tobacco Use  . Smoking status: Never Smoker  Substance Use Topics  . Alcohol use: Yes    Comment: occasionally  . Drug use: No    Review of Systems Constitutional: No fever. Eyes: No visual changes. ENT: No sore throat. Cardiovascular: Denies chest pain. Respiratory: Denies shortness of breath. Gastrointestinal: No nausea, vomiting, diarrhea. Genitourinary: Negative for dysuria. Musculoskeletal: Negative for back pain. Skin: Negative for  rash. Neurological: Negative for focal weakness or numbness.  ____________________________________________   PHYSICAL EXAM:  VITAL SIGNS: ED Triage Vitals  Enc Vitals Group     BP 11/08/20 2255 (!) 170/110     Pulse Rate 11/08/20 2255 72     Resp 11/08/20 2255 16     Temp 11/08/20 2255 98.3 F (36.8 C)     Temp Source 11/08/20 2255 Oral     SpO2 11/08/20 2255 94 %     Weight 11/08/20 2256 240 lb (108.9 kg)     Height 11/08/20 2256 5\' 11"  (1.803 m)     Head Circumference --      Peak Flow --      Pain Score 11/08/20 2256 0     Pain Loc --      Pain Edu? --      Excl. in [place]? --    CONSTITUTIONAL: Alert and oriented and responds appropriately to questions. Well-appearing; well-nourished HEAD: Normocephalic EYES: Conjunctivae clear, pupils appear equal, EOM appear intact ENT: normal nose; moist mucous membranes NECK: Supple, normal ROM CARD: RRR; S1 and S2 appreciated; no murmurs, no clicks, no rubs, no gallops RESP: Normal chest excursion without splinting or tachypnea; breath sounds clear and equal bilaterally; no wheezes, no rhonchi, no rales, no hypoxia or respiratory distress, speaking full sentences ABD/GI: Normal bowel sounds; non-distended; soft, non-tender, no rebound, no guarding, no peritoneal signs, no hepatosplenomegaly BACK: The back appears normal EXT: Normal ROM in all joints; no deformity noted, no edema; no cyanosis, no calf tenderness or calf swelling SKIN: Normal color for age and race; warm; no rash on exposed skin NEURO: Moves all extremities equally PSYCH: The patient's mood and manner are appropriate.  ____________________________________________   LABS (all labs ordered are listed, but only abnormal results are displayed)  Labs Reviewed  BASIC METABOLIC PANEL - Abnormal; Notable for the following components:      Result Value   Glucose, Bld 104 (*)    All other components within normal limits  CBC WITH DIFFERENTIAL/PLATELET  MAGNESIUM    ____________________________________________  EKG   EKG Interpretation  Date/Time:  Tuesday November 08 2020 22:54:40 EDT Ventricular Rate:  79 PR Interval:    QRS Duration: 101 QT Interval:  420 QTC Calculation: 479 R Axis:   1 Text Interpretation: Sinus rhythm Borderline prolonged QT interval No significant change since last tracing Confirmed by Pryor Curia [phone]) on 11/08/2020 11:08:05 PM       ____________________________________________  RADIOLOGY Jessie Foot Saleah Rishel, personally viewed and evaluated these images (plain radiographs) as part of my medical decision making, as well as reviewing the written report by the radiologist.  ED MD interpretation:  none  Official radiology report(s): No results found.  ____________________________________________   PROCEDURES  Procedure(s) performed (including Critical Care):  Procedures  12:41 AM Cardiac monitoring reveals NSR rate 65 (Rate & rhythm), as reviewed and interpreted by me. Cardiac monitoring was ordered due to palpitations and to monitor patient for dysrhythmia.  ____________________________________________   INITIAL IMPRESSION / ASSESSMENT AND PLAN / ED COURSE  As part of my medical decision making, I reviewed the following data within the [place] notes reviewed and incorporated, Labs reviewed , EKG interpreted , Old EKG reviewed, Old chart reviewed and Notes from prior ED visits         Patient here with episode of SVT that has resolved after vagal maneuvers.  Heart rate currently in the 70s in sinus rhythm without ischemic change or interval abnormality other than a slightly prolonged QT interval.  He is slightly hypertensive.  Will monitor this.  He is asymptomatic currently.  He states that he is not interested in a cardiac ablation at this time and is wondering what else he can do to avoid these episodes in the future.  Have advised him to cut down on his caffeine intake.  I do  not feel comfortable increasing his metoprolol at this time given his heart rate has been as low as 50 even before starting metoprolol.  He states he has cardiology follow-up.  Will check hemoglobin, electrolytes today to ensure no abnormality that triggered this event.  He has no chest pain, shortness of breath.  Doubt ACS, PE, dissection.  His TSH on review of records was normal in November 2021.  ED PROGRESS  Patient's labs are reassuring.  Normal hemoglobin, normal electrolytes.  Continues to be in sinus rhythm with a rate in the 60s to 70s and is asymptomatic.  Recommended discharge home with continued metoprolol, decrease caffeine intake and close outpatient follow-up with cardiology.  He is comfortable with this plan.   I have refilled patient's metoprolol 25 mg twice daily.  Have again encouraged him to follow-up with cardiology.  He states he did not make an appointment after his last visit to the emergency department in February.  He states he will call in the morning.   At this time, I do not feel there is any life-threatening condition present. I have reviewed, interpreted and discussed all results (EKG, imaging, lab, urine as appropriate) and exam findings with patient/family. I have reviewed nursing notes and appropriate previous records.  I feel the patient is safe to be discharged home without further emergent workup and can continue workup as an outpatient as needed. Discussed usual and customary return precautions. Patient/family verbalize understanding and are comfortable with this plan.  Outpatient follow-up has been provided as needed. All questions have been answered.  ____________________________________________   FINAL CLINICAL IMPRESSION(S) / ED DIAGNOSES  Final diagnoses:  Palpitations     ED Discharge Orders         Ordered    metoprolol tartrate (LOPRESSOR) 25 MG tablet  2 times daily        11/09/20 0039          *Please note:  Denzil Magnuson was evaluated  in Emergency Department on 11/09/2020 for the symptoms described in the history of present illness. He was evaluated in the context of the global COVID-19 pandemic, which necessitated consideration that the patient might be at risk for infection with the SARS-CoV-2 virus that causes COVID-19. Institutional protocols and algorithms that pertain to the evaluation of patients at risk for COVID-19 are in  a state of rapid change based on information released by regulatory bodies including the CDC and federal and state organizations. These policies and algorithms were followed during the patient's care in the ED.  Some ED evaluations and interventions may be delayed as a result of limited staffing during and the pandemic.*   Note:  This document was prepared using Dragon voice recognition software and may include unintentional dictation errors.   Derica Leiber, Delice Bison, DO 11/09/20 0021    Melanie Pellot, Delice Bison, DO 11/09/20 9747

## 2020-11-08 NOTE — ED Triage Notes (Signed)
Pt BIBA via [facility] c/o of fast heart rate. Pt has hx of SVT. EMS reports that pt has at home pulse ox monitor that showed HR of 202. EMS stated that when they arrive pt was blowing throw syringe trying vagal maneuvers himself and HR was down to 160. Once EMS was able to hook up pt EKG, pt had converted back to NSR. Pt states he took his nightly dose of metoprolol PTA.   Pt currently NSR on monitor. Pt denies any other complaints at this time.

## 2020-11-09 MED ORDER — METOPROLOL TARTRATE 25 MG PO TABS: 25.0000 mg | ORAL_TABLET | Freq: Two times a day (BID) | ORAL | 0 refills | Status: AC

## 2021-01-04 ENCOUNTER — Ambulatory Visit: Payer: BC Managed Care – PPO | Admitting: Dermatology

## 2021-01-11 ENCOUNTER — Other Ambulatory Visit: Payer: Self-pay

## 2021-01-11 ENCOUNTER — Ambulatory Visit (INDEPENDENT_AMBULATORY_CARE_PROVIDER_SITE_OTHER): Payer: BC Managed Care – PPO | Admitting: Dermatology

## 2021-01-11 DIAGNOSIS — D229 Melanocytic nevi, unspecified: Secondary | ICD-10-CM

## 2021-01-11 DIAGNOSIS — L570 Actinic keratosis: Secondary | ICD-10-CM | POA: Diagnosis not present

## 2021-01-11 DIAGNOSIS — L821 Other seborrheic keratosis: Secondary | ICD-10-CM | POA: Diagnosis not present

## 2021-01-11 DIAGNOSIS — L814 Other melanin hyperpigmentation: Secondary | ICD-10-CM

## 2021-01-11 DIAGNOSIS — Z1283 Encounter for screening for malignant neoplasm of skin: Secondary | ICD-10-CM | POA: Diagnosis not present

## 2021-01-11 DIAGNOSIS — L820 Inflamed seborrheic keratosis: Secondary | ICD-10-CM

## 2021-01-11 DIAGNOSIS — L578 Other skin changes due to chronic exposure to nonionizing radiation: Secondary | ICD-10-CM

## 2021-01-11 DIAGNOSIS — D1800 Hemangioma unspecified site: Secondary | ICD-10-CM

## 2021-01-11 NOTE — Progress Notes (Signed)
New Patient Visit  Subjective  Jorge Obrien is a 65 y.o. male who presents for the following: Skin Problem (Check  irregular growths on the chest, back, neck changing color ). The patient presents for Upper Body Skin Exam (UBSE) for skin cancer screening and mole check.  The following portions of the chart were reviewed this encounter and updated as appropriate:   Allergies  Meds  Problems  Med Hx  Surg Hx  Fam Hx     Review of Systems:  No other skin or systemic complaints except as noted in HPI or Assessment and Plan.  Objective  Well appearing patient in no apparent distress; mood and affect are within normal limits.  A focused examination was performed including face,chest . Relevant physical exam findings are noted in the Assessment and Plan.  All skin from waist up examined today.  Objective  right mid back, left medial pectoral parasternal, right posterior neck (3): Erythematous keratotic or waxy stuck-on papule or plaque.   Objective  scalp (2): Erythematous thin papules/macules with gritty scale.    Assessment & Plan  Inflamed seborrheic keratosis (3) right mid back, left medial pectoral parasternal, right posterior neck  Destruction of lesion - right mid back, left medial pectoral parasternal, right posterior neck Complexity: simple   Destruction method: cryotherapy   Informed consent: discussed and consent obtained   Timeout:  patient name, date of birth, surgical site, and procedure verified Lesion destroyed using liquid nitrogen: Yes   Region frozen until ice ball extended beyond lesion: Yes   Outcome: patient tolerated procedure well with no complications   Post-procedure details: wound care instructions given    AK (actinic keratosis) (2) scalp  Destruction of lesion - scalp Complexity: simple   Destruction method: cryotherapy   Informed consent: discussed and consent obtained   Timeout:  patient name, date of birth, surgical site, and procedure  verified Lesion destroyed using liquid nitrogen: Yes   Region frozen until ice ball extended beyond lesion: Yes   Outcome: patient tolerated procedure well with no complications   Post-procedure details: wound care instructions given     Lentigines - Scattered tan macules - Due to sun exposure - Benign-appering, observe - Recommend daily broad spectrum sunscreen SPF 30+ to sun-exposed areas, reapply every 2 hours as needed. - Call for any changes  Seborrheic Keratoses - Stuck-on, waxy, tan-brown papules and/or plaques  - Benign-appearing - Discussed benign etiology and prognosis. - Observe - Call for any changes  Melanocytic Nevi - Tan-brown and/or pink-flesh-colored symmetric macules and papules - Benign appearing on exam today - Observation - Call clinic for new or changing moles - Recommend daily use of broad spectrum spf 30+ sunscreen to sun-exposed areas.   Hemangiomas - Red papules - Discussed benign nature - Observe - Call for any changes  Actinic Damage - Chronic condition, secondary to cumulative UV/sun exposure - diffuse scaly erythematous macules with underlying dyspigmentation - Recommend daily broad spectrum sunscreen SPF 30+ to sun-exposed areas, reapply every 2 hours as needed.  - Staying in the shade or wearing long sleeves, sun glasses (UVA+UVB protection) and wide brim hats (4-inch brim around the entire circumference of the hat) are also recommended for sun protection.  - Call for new or changing lesions.  Skin cancer screening performed today.  Return in about 1 year (around 01/11/2022) for UBSE, Hx of Aks .  Marta Lamas, CMA, am acting as scribe for Sarina Ser, MD .  Documentation: I have reviewed  the above documentation for accuracy and completeness, and I agree with the above.  Sarina Ser, MD

## 2021-01-11 NOTE — Patient Instructions (Addendum)

## 2021-01-20 ENCOUNTER — Encounter: Payer: Self-pay | Admitting: Dermatology

## 2022-01-09 DIAGNOSIS — I471 Supraventricular tachycardia: Secondary | ICD-10-CM | POA: Diagnosis not present

## 2022-01-30 ENCOUNTER — Encounter: Payer: Self-pay | Admitting: Neurology

## 2022-01-30 ENCOUNTER — Ambulatory Visit: Payer: PPO | Admitting: Neurology

## 2022-01-30 VITALS — BP 158/82 | HR 50 | Ht 71.0 in | Wt 218.0 lb

## 2022-01-30 DIAGNOSIS — Z0000 Encounter for general adult medical examination without abnormal findings: Secondary | ICD-10-CM | POA: Diagnosis not present

## 2022-01-30 DIAGNOSIS — G4733 Obstructive sleep apnea (adult) (pediatric): Secondary | ICD-10-CM

## 2022-01-30 NOTE — Progress Notes (Unsigned)
[facility]  PATIENT: Jorge Obrien DOB: [birth year]  REQUESTING CLINICIAN: Rusty Aus, Obrien HISTORY FROM: Patient  REASON FOR VISIT: Neurology clearance for FAA    HISTORICAL  CHIEF COMPLAINT:  Chief Complaint  Patient presents with   New Patient (Initial Visit)    Rm 12. Alone. NP/Paper/[facility]/Neuro exam for pilots license.    HISTORY OF PRESENT ILLNESS:  Jorge Obrien is a 66 year old gentleman past medical history of anxiety, atypical chest pain, hyperlipidemia, recent COVID-pneumonia, obstructive sleep apnea who is presenting needing clearance for FAA.  He is a Dietitian and also a Programme researcher, broadcasting/film/video.  Patient reports back in 2000, he did have 2 nocturnal events described as shaking during sleep.  Events were observed by his former wife, who described as vibrating and shaking before waking up.  Episodes usually last 15 to 30 seconds.  Patient reports during this time he had a feeling of coming out of a dream.  He did have extensive work-up including MRI and EEGs and work-up has been negative.  He did also have a sleep study which show evidence of obstructive sleep apnea. The events were thought to be related to sleep apnea and not seizures.   Patient reports since then he has not had any additional event, he has been 23 years.  He denies any history of seizures, denies any family history of seizures but reported father had glioblastoma. Currently he denies any additional symptoms, he works as a Dietitian, and is on Network engineer.  Reported sleep is good, will get about 7 hours and no other complaints or concerns.  OTHER MEDICAL CONDITIONS: SVT, obstructive sleep apnea, recent COVID-pneumonia   REVIEW OF SYSTEMS: Full 14 system review of systems performed and negative with exception of: as noted in the HPI   ALLERGIES: Allergies  Allergen Reactions   Omnicef [Cefdinir] Rash    HOME MEDICATIONS: Outpatient Medications Prior to Visit   Medication Sig Dispense Refill   ibuprofen (ADVIL,MOTRIN) 200 MG tablet 200 mg. Take 3 tablets every 12 hrs as needed for pain     metoprolol tartrate (LOPRESSOR) 25 MG tablet Take 1 tablet (25 mg total) by mouth 2 (two) times daily. 60 tablet 0   meloxicam (MOBIC) 15 MG tablet Take 1 tablet (15 mg total) by mouth daily. 30 tablet 3   methylPREDNIsolone (MEDROL DOSPACK) 4 MG tablet follow package directions 21 tablet 0   No facility-administered medications prior to visit.    PAST MEDICAL HISTORY: Past Medical History:  Diagnosis Date   Anxiety 02/2006   Anxiety and depression 05/2006   [facility]   Electrolyte disorder 05/2006   [facility]   Hyperlipidemia 09/2006   Pneumonia 05/2006   [facility]   Seizures ([place]) 2012   History of- nightime and was on Klonopin/Dilantin.  Eval at [facility]. Eval by Dr. Erling Cruz    PAST SURGICAL HISTORY: Past Surgical History:  Procedure Laterality Date   TONSILLECTOMY     As a child.    FAMILY HISTORY: Family History  Problem Relation Age of Onset   Cataracts Mother    Hypertension Father    Cancer Father        Brain glioblastoma   Cancer Brother        Testicular CA, pituitary adenoma   Hypertension Brother    Alcohol abuse Maternal Uncle    Heart disease Neg Hx    Diabetes Neg Hx    Stroke Neg Hx    Cancer Brother  Melanoma   Seizures Other     SOCIAL HISTORY: Social History   Socioeconomic History   Marital status: Married    Spouse name: Not on file   Number of children: 1   Years of education: Not on file   Highest education level: Not on file  Occupational History   Occupation: Optometrist    Employer: [employer]  Tobacco Use   Smoking status: Never   Smokeless tobacco: Not on file  Substance and Sexual Activity   Alcohol use: Yes    Comment: occasionally   Drug use: No   Sexual activity: Not on file  Other Topics Concern   Not on file  Social History Narrative   From [place] [employer]   Divorced with 1 son  (has 50% of time)   Exercise:  Previous jogger, has joined the [employer]   Social Determinants of Radio broadcast assistant Strain: Not on file  Food Insecurity: Not on file  Transportation Needs: Not on file  Physical Activity: Not on file  Stress: Not on file  Social Connections: Not on file  Intimate Partner Violence: Not on file    PHYSICAL EXAM  GENERAL EXAM/CONSTITUTIONAL: Vitals:  Vitals:   01/30/22 0800  BP: (!) 158/82  Pulse: (!) 50  Weight: 218 lb (98.9 kg)  Height: '5\' 11"'$  (1.803 m)   Body mass index is 30.4 kg/m. Wt Readings from Last 3 Encounters:  01/30/22 218 lb (98.9 kg)  11/08/20 240 lb (108.9 kg)  10/09/20 213 lb (96.6 kg)   Patient is in no distress; well developed, nourished and groomed; neck is supple  CARDIOVASCULAR: Examination of carotid arteries is normal; no carotid bruits Regular rate and rhythm, no murmurs Examination of peripheral vascular system by observation and palpation is normal  EYES: Pupils round and reactive to light, Visual fields full to confrontation, Extraocular movements intacts,   MUSCULOSKELETAL: Gait, strength, tone, movements noted in Neurologic exam below  NEUROLOGIC: MENTAL STATUS:      View : No data to display.         awake, alert, oriented to person, place and time recent and remote memory intact normal attention and concentration language fluent, comprehension intact, naming intact fund of knowledge appropriate  CRANIAL NERVE:  2nd, 3rd, 4th, 6th - pupils equal and reactive to light, visual fields full to confrontation, extraocular muscles intact, no nystagmus 5th - facial sensation symmetric 7th - facial strength symmetric 8th - hearing intact 9th - palate elevates symmetrically, uvula midline 11th - shoulder shrug symmetric 12th - tongue protrusion midline  MOTOR:  normal bulk and tone, full strength in the BUE, BLE  SENSORY:  normal and symmetric to light touch, pinprick,  temperature, vibration  COORDINATION:  finger-nose-finger, fine finger movements normal  REFLEXES:  deep tendon reflexes present and symmetric  GAIT/STATION:  normal   DIAGNOSTIC DATA (LABS, IMAGING, TESTING) - I reviewed patient records, labs, notes, testing and imaging myself where available.  Lab Results  Component Value Date   WBC 7.1 11/08/2020   HGB 15.5 11/08/2020   HCT 43.2 11/08/2020   MCV 90.0 11/08/2020   PLT 189 11/08/2020      Component Value Date/Time   NA 136 11/08/2020 2255   K 3.6 11/08/2020 2255   CL 105 11/08/2020 2255   CO2 24 11/08/2020 2255   GLUCOSE 104 (H) 11/08/2020 2255   BUN 22 11/08/2020 2255   CREATININE 1.15 11/08/2020 2255   CALCIUM 9.0 11/08/2020 2255  PROT 6.9 10/09/2020 2022   ALBUMIN 4.3 10/09/2020 2022   AST 34 10/09/2020 2022   ALT 41 10/09/2020 2022   ALKPHOS 66 10/09/2020 2022   BILITOT 1.0 10/09/2020 2022   GFRNONAA >60 11/08/2020 2255   GFRAA >60 05/25/2020 0937   Lab Results  Component Value Date   CHOL 230 (H) 08/31/2010   HDL 47.80 08/31/2010   LDLCALC 131 (H) 09/27/2006   LDLDIRECT 155.8 08/31/2010   TRIG 82.0 08/31/2010   CHOLHDL 5 08/31/2010   No results found for: HGBA1C No results found for: VITAMINB12 Lab Results  Component Value Date   TSH 2.14 06/11/2008    MRI done in June 2000 review, normal brain  EEG done in June 2000 show a normal EEG recording.   ASSESSMENT AND PLAN  66 y.o. year old male with history of anxiety, SVT, recent COVID-pneumonia who is presenting for FAA clearance.  Patient reports back in 2000 he had 2 nocturnal events described as vibrating and shaking during sleep.  Per report, patient felt like he was coming out of a dream.  He did have extensive work-up including MRIs and EEG which were all normal.  He did also have a sleep study which showed evidence of obstructive obstructive sleep apnea.  Patient events were thought to be related to sleep apnea.  Currently he is not using  the CPAP machine and has not had a recent sleep study done.   I informed patient that those events were likely not seizures since all work-up has been negative and he has not had any additional events for the past 23 years.  I reviewed also the initial work-up done by previous neurologist Dr. Erling Cruz, Dr. Ginny Forth from [facility] and Dr. Michaela Corner from [facility] clinic Department of neurology. At this point I will hold off recommend patient to repeat sleep study to evaluate the degree of sleep apnea, he does report losing weight and currently not using a C- Pap machine.  Follow-up as needed   Discussed with sleep neuro   1. Normal neurological exam   2. OSA (obstructive sleep apnea)      Patient Instructions  Continue current medication Consider sleep study to evaluate for extent of sleep apnea Follow-up with your primary care doctor Return as needed.  No orders of the defined types were placed in this encounter.   No orders of the defined types were placed in this encounter.   Return if symptoms worsen or fail to improve.  I have spent a total of 60 minutes dedicated to this patient today, preparing to see patient, performing a medically appropriate examination and evaluation, ordering tests and/or medications and procedures, and counseling and educating the patient/family/caregiver; independently interpreting result and communicating results to the family/patient/caregiver; and documenting clinical information in the electronic medical record.   Alric Ran, Obrien 01/30/2022, 8:05 PM  [facility] [address] [place], [place] [address] [phone]

## 2022-01-30 NOTE — Patient Instructions (Signed)
Continue current medication Referral for sleep study to evaluate for extent of sleep apnea Follow-up with your primary care doctor Return as needed.

## 2022-02-01 ENCOUNTER — Telehealth: Payer: Self-pay | Admitting: Neurology

## 2022-02-01 NOTE — Telephone Encounter (Signed)
Referral for Sleep Study sent to [facility].

## 2022-02-14 ENCOUNTER — Encounter: Payer: Self-pay | Admitting: Neurology

## 2022-02-14 ENCOUNTER — Ambulatory Visit: Payer: PPO | Admitting: Neurology

## 2022-02-14 VITALS — BP 146/81 | HR 53 | Ht 71.0 in | Wt 223.2 lb

## 2022-02-14 DIAGNOSIS — E669 Obesity, unspecified: Secondary | ICD-10-CM | POA: Diagnosis not present

## 2022-02-14 DIAGNOSIS — I471 Supraventricular tachycardia: Secondary | ICD-10-CM

## 2022-02-14 DIAGNOSIS — G4733 Obstructive sleep apnea (adult) (pediatric): Secondary | ICD-10-CM

## 2022-02-14 DIAGNOSIS — R351 Nocturia: Secondary | ICD-10-CM | POA: Diagnosis not present

## 2022-02-14 NOTE — Patient Instructions (Signed)
Thank you for choosing [facility] for your sleep related care! It was nice to meet you today!   Here is what we discussed today:    Based on your symptoms and your exam I believe you may still be at risk for obstructive sleep apnea (aka OSA). We should proceed with a sleep study to determine whether you do or do not have OSA and how severe it is. Even, if you have mild OSA, I may want you to consider treatment with CPAP, as treatment of even borderline or mild sleep apnea can result and improvement of symptoms such as sleep disruption, daytime sleepiness, nighttime bathroom breaks, restless leg symptoms, improvement of headache syndromes, even improved mood disorder.   As explained, an attended sleep study (meaning you get to stay overnight in the sleep lab), lets us monitor sleep-related behaviors such as sleep talking and leg movements in sleep, in addition to monitoring for sleep apnea.  A home sleep test is a screening tool for sleep apnea diagnosis only, but unfortunately, does not help with any other sleep-related diagnoses.  Please remember, the long-term risks and ramifications of untreated moderate to severe obstructive sleep apnea may include (but are not limited to): increased risk for cardiovascular disease, including congestive heart failure, stroke, difficult to control hypertension, treatment resistant obesity, arrhythmias, especially irregular heartbeat commonly known as A. Fib. (atrial fibrillation); even type 2 diabetes has been linked to untreated OSA.   Other correlations that untreated obstructive sleep apnea include macular edema which is swelling of the retina in the eyes, droopy eyelid syndrome, and elevated hemoglobin and hematocrit levels (often referred to as polycythemia).  Sleep apnea can cause disruption of sleep and sleep deprivation in most cases, which, in turn, can cause recurrent headaches, problems with memory, mood, concentration, focus, and  vigilance. Most people with untreated sleep apnea report excessive daytime sleepiness, which can affect their ability to drive. Please do not drive or use heavy equipment or machinery, if you feel sleepy! Patients with sleep apnea can also develop difficulty initiating and maintaining sleep (aka insomnia).   Having sleep apnea may increase your risk for other sleep disorders, including involuntary behaviors sleep such as sleep terrors, sleep talking, sleepwalking.    Having sleep apnea can also increase your risk for restless leg syndrome and leg movements at night.   Please note that untreated obstructive sleep apnea may carry additional perioperative morbidity. Patients with significant obstructive sleep apnea (typically, in the moderate to severe degree) should receive, if possible, perioperative PAP (positive airway pressure) therapy and the surgeons and particularly the anesthesiologists should be informed of the diagnosis and the severity of the sleep disordered breathing.   We will call you or email you through MyChart with regards to your test results and plan a follow-up in sleep clinic accordingly. Most likely, you will hear from one of our nurses.   Our sleep lab administrative assistant will call you to schedule your sleep study and give you further instructions, regarding the check in process for the sleep study, arrival time, what to bring, when you can expect to leave after the study, etc., and to answer any other logistical questions you may have. If you don't hear back from her by about 2 weeks from now, please feel free to call her direct line at [phone] or you can call our general clinic number, or email us through My Chart.   

## 2022-02-14 NOTE — Progress Notes (Signed)
Subjective:    Patient ID: Jorge Obrien is a 66 y.o. male.  HPI    Star Age, MD, PhD [facility] [address] [place], [place] [address]  Dear [name],   I saw your patient, [name], upon your kind request in my sleep clinic today for initial consultation of his sleep disorder, in particular, evaluation of his prior diagnosis of obstructive sleep apnea.  The patient is unaccompanied today.  As you know, [name] is a 66 year old right-handed gentleman with an underlying medical history of PSVT, hyperlipidemia, anxiety, depression, history of covid pneumonia, history of seizure-like event in the past, and mild obesity, who was previously diagnosed with obstructive sleep apnea.  He is currently not on CPAP or AutoPap therapy.  I reviewed your office note from 01/30/2022.  Prior sleep study results are not available for my review today.  As he recalls, originally, he was diagnosed some 10+ years ago and had a CPAP machine.  He could not tolerate it at the time, tried different masks.  He still has his old machine but has not used it.  About a year ago or little over a year ago his cardiologist ordered a home sleep test.  I do not have the records available for review but patient reports that he tested in the borderline moderate range with an AHI around 15/h.  He has not been really started on PAP therapy. His Epworth sleepiness score is 2 out of 24, fatigue severity score is 13 out of 63.  He has no family history of sleep apnea as far as he recalls.  He has been a Programme researcher, broadcasting/film/video.  A laboratory attended sleep study was requested or recommended by the [facility].  He would be willing to have a laboratory attended sleep study done.  Bedtime is generally between 9:30 and 10 and rise time around 6 AM.  He does have nocturia about 2 times per average night and denies recurrent morning headaches.  He typically sleeps on his right side.  He is trying to lose weight.   He is divorced, lives alone, has 1 son, age 22.  He drinks caffeine in the form of coffee, about 3 cups in the morning.  He quit drinking alcohol altogether when he was diagnosed with COVID but recently has restarted drinking beer, about 2/day on average.  He is a non-smoker.  He had a tonsillectomy as a child.  He has a TV in his bedroom but turns it off before falling asleep.  He has no pets in the household.  His Past Medical History Is Significant For: Past Medical History:  Diagnosis Date   Anxiety 02/2006   Anxiety and depression 05/2006   [facility]   Electrolyte disorder 05/2006   [facility]   Hyperlipidemia 09/2006   Pneumonia 05/2006   [facility]   Seizures ([place]) 2012   History of- nightime and was on Klonopin/Dilantin.  Eval at [facility]. Eval by Dr. Erling Cruz    His Past Surgical History Is Significant For: Past Surgical History:  Procedure Laterality Date   TONSILLECTOMY     As a child.    His Family History Is Significant For: Family History  Problem Relation Age of Onset   Cataracts Mother    Hypertension Father    Cancer Father        Brain glioblastoma   Cancer Brother        Testicular CA, pituitary adenoma   Hypertension Brother  Cancer Brother        Melanoma   Alcohol abuse Maternal Uncle    Seizures Other    Heart disease Neg Hx    Diabetes Neg Hx    Stroke Neg Hx    Sleep apnea Neg Hx     His Social History Is Significant For: Social History   Socioeconomic History   Marital status: Married    Spouse name: Not on file   Number of children: 1   Years of education: Not on file   Highest education level: Not on file  Occupational History   Occupation: Optometrist    Employer: [employer]  Tobacco Use   Smoking status: Never   Smokeless tobacco: Not on file  Substance and Sexual Activity   Alcohol use: Yes    Alcohol/week: 14.0 standard drinks of alcohol    Types: 14 Glasses of wine per week   Drug use: No   Sexual activity: Not on file   Other Topics Concern   Not on file  Social History Narrative   From [place] [employer]   Divorced with 1 son (has 50% of time)   Exercise:  Previous jogger, has joined the [employer]   Social Determinants of Radio broadcast assistant Strain: Not on file  Food Insecurity: Not on file  Transportation Needs: Not on file  Physical Activity: Not on file  Stress: Not on file  Social Connections: Not on file    His Allergies Are:  Allergies  Allergen Reactions   Omnicef [Cefdinir] Rash  :   His Current Medications Are:  Outpatient Encounter Medications as of 02/14/2022  Medication Sig   Cholecalciferol (VITAMIN D3 PO) Take by mouth daily.   ibuprofen (ADVIL,MOTRIN) 200 MG tablet 200 mg. Take 3 tablets every 12 hrs as needed for pain   Multiple Vitamins-Minerals (ZINC PO) Take by mouth daily.   metoprolol tartrate (LOPRESSOR) 25 MG tablet Take 1 tablet (25 mg total) by mouth 2 (two) times daily.   No facility-administered encounter medications on file as of 02/14/2022.  :   Review of Systems:  Out of a complete 14 point review of systems, all are reviewed and negative with the exception of these symptoms as listed below:  Review of Systems  Neurological:        Pt here for sleep consult  Pt denies snoring ,fatigue hypertension ,and headaches . Pt states at 20 years ago . Pt has a CPAP at home never used it . Pt trying to get FAA clearance     Objective:  Neurological Exam  Physical Exam Physical Examination:   Vitals:   02/14/22 1549  BP: (!) 146/81  Pulse: (!) 53   General Examination: The patient is a very pleasant 66 y.o. male in no acute distress. He appears well-developed and well-nourished and well groomed.   HEENT: Normocephalic, atraumatic, pupils are equal, round and reactive to light, extraocular tracking is good without limitation to gaze excursion or nystagmus noted. Hearing is grossly intact. Face is symmetric with normal facial animation. Speech is clear  with no dysarthria noted. There is no hypophonia. There is no lip, neck/head, jaw or voice tremor. Neck is supple with full range of passive and active motion. There are no carotid bruits on auscultation. Oropharynx exam reveals: mild mouth dryness, good dental hygiene and mild airway crowding, due to mildly redundant soft palate but otherwise benign findings, tonsils absent.  Mallampati class II. Tongue protrudes centrally and palate elevates symmetrically.  Chest: Clear to auscultation without wheezing, rhonchi or crackles noted.  Heart: S1+S2+0, regular and normal without murmurs, rubs or gallops noted.   Abdomen: Soft, non-tender and non-distended.  Extremities: There is no obvious edema in the distal lower extremities bilaterally.   Skin: Warm and dry without trophic changes noted.   Musculoskeletal: exam reveals no obvious joint deformities.   Neurologically:  Mental status: The patient is awake, alert and oriented in all 4 spheres. His immediate and remote memory, attention, language skills and fund of knowledge are appropriate. There is no evidence of aphasia, agnosia, apraxia or anomia. Speech is clear with normal prosody and enunciation. Thought process is linear. Mood is normal and affect is normal.  Cranial nerves II - XII are as described above under HEENT exam.  Motor exam: Normal bulk, strength and tone is noted. There is no obvious tremor.  Fine motor skills and coordination: grossly intact.  Cerebellar testing: No dysmetria or intention tremor. There is no truncal or gait ataxia.  Sensory exam: intact to light touch in the upper and lower extremities.  Gait, station and balance: He stands easily. No veering to one side is noted. No leaning to one side is noted. Posture is age-appropriate and stance is narrow based. Gait shows normal stride length and normal pace. No problems turning are noted.   Assessment and Plan:   In summary, RODRIQUES BADIE is a very pleasant 66 y.o.-year  old male with an underlying medical history of PSVT, hyperlipidemia, anxiety, depression, history of covid pneumonia, history of seizure-like event in the past, and mild obesity, who presents for evaluation of his obstructive sleep apnea.  He was diagnosed several years ago and was placed on CPAP therapy or AutoPap therapy at the time but was not able to use his machine.  He had a home sleep test a little over a year ago which indicated moderate obstructive sleep apnea or borderline moderate findings as per his recollection, he is advised to send [place] a copy of the home sleep test report.  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  He is advised that moderate obstructive sleep apnea is recommended to be treated and first-line treatment is with a positive airway pressure device.  I had a long chat with the patient about my findings and the diagnosis of OSA, its prognosis and treatment options. We talked about medical treatments, surgical interventions and non-pharmacological approaches. I explained in particular the risks and ramifications of untreated moderate to severe OSA, especially with respect to developing cardiovascular disease down the Road, including congestive heart failure, difficult to treat hypertension, cardiac arrhythmias, or stroke. Even type 2 diabetes has, in part, been linked to untreated OSA. Symptoms of untreated OSA include daytime sleepiness, memory problems, mood irritability and mood disorder such as depression and anxiety, lack of energy, as well as recurrent headaches, especially morning headaches. We talked about trying to maintain a healthy lifestyle in general, as well as the importance of weight control. We also talked about the importance of good sleep hygiene.  He is advised to limit his alcohol intake to up to 2 servings per day. I recommended the following at this time: sleep study.  I will order a  laboratory sleep study. I explained the sleep test procedure to the patient and also outlined possible surgical and non-surgical treatment options of OSA, including the use of a custom-made dental device (which would require  a referral to a specialist dentist or oral surgeon), upper airway surgical options, such as traditional UPPP or a novel less invasive surgical option in the form of Inspire hypoglossal nerve stimulation (which would involve a referral to an ENT surgeon). I also explained the CPAP treatment option to the patient, who indicated that he would be willing to try PAP therapy again if the need arises. I explained the importance of being compliant with PAP treatment, not only for insurance purposes but primarily to improve His symptoms, and for the patient's long term health benefit, including to reduce His cardiovascular risks. I answered all his questions today and the patient was in agreement. I plan to see him back after the sleep study is completed and encouraged him to call with any interim questions, concerns, problems or updates.   Thank you very much for allowing me to participate in the care of this nice patient. If I can be of any further assistance to you please do not hesitate to call me at [phone].  Sincerely,   Star Age, MD, PhD

## 2022-02-15 ENCOUNTER — Telehealth: Payer: Self-pay | Admitting: Neurology

## 2022-02-15 NOTE — Telephone Encounter (Signed)
HTA pending faxed notes 

## 2022-02-19 NOTE — Telephone Encounter (Signed)
Called to check the status with HTA- it is still pending.

## 2022-02-21 ENCOUNTER — Telehealth: Payer: Self-pay | Admitting: Neurology

## 2022-02-21 NOTE — Telephone Encounter (Signed)
Patient returned my call he is scheduled at [facility] for 03/06/22 at 8 pm.

## 2022-02-21 NOTE — Telephone Encounter (Signed)
Pt calling to see if Dr. Rexene Alberts has received the home sleep study results had done previously. Would like a call from the nurse.

## 2022-02-21 NOTE — Telephone Encounter (Signed)
Patient returned my call he is scheduled at [facility] for 03/06/22 at 8 pm.

## 2022-02-21 NOTE — Telephone Encounter (Signed)
LVM for pt to call back to schedule  HTA auth: [id number] (exp. 02/15/22 to 05/16/22)

## 2022-02-21 NOTE — Telephone Encounter (Signed)
I called pt and relayed that yes, in Dr. Guadelupe Sabin in box to review.  Asked about other SS, looks like authorized.  Will relay message to emily in sleep lab that you are inquiring.  He appreciated call back.

## 2022-03-01 ENCOUNTER — Institutional Professional Consult (permissible substitution): Payer: PPO | Admitting: Neurology

## 2022-03-06 ENCOUNTER — Ambulatory Visit (INDEPENDENT_AMBULATORY_CARE_PROVIDER_SITE_OTHER): Payer: PPO | Admitting: Neurology

## 2022-03-06 DIAGNOSIS — G4720 Circadian rhythm sleep disorder, unspecified type: Secondary | ICD-10-CM

## 2022-03-06 DIAGNOSIS — G4733 Obstructive sleep apnea (adult) (pediatric): Secondary | ICD-10-CM | POA: Diagnosis not present

## 2022-03-06 DIAGNOSIS — E669 Obesity, unspecified: Secondary | ICD-10-CM

## 2022-03-06 DIAGNOSIS — I471 Supraventricular tachycardia: Secondary | ICD-10-CM

## 2022-03-06 DIAGNOSIS — R351 Nocturia: Secondary | ICD-10-CM

## 2022-03-12 ENCOUNTER — Telehealth: Payer: Self-pay | Admitting: Neurology

## 2022-03-12 NOTE — Procedures (Signed)
PATIENT'S NAME:  Jorge Obrien, Schaffert DOB:      05-31-1956      MR#:    [id number]     DATE OF RECORDING: 03/06/2022 REFERRING M.D.:  Alric Ran, MD Study Performed:   Baseline Polysomnogram HISTORY: 66 year old man with a history of PSVT, hyperlipidemia, anxiety, depression, history of covid-pneumonia, history of seizure-like event (in the past), and mild obesity, who was previously diagnosed with obstructive sleep apnea. 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 A recent home sleep test indicated moderate sleep apnea. The patient endorsed the Epworth Sleepiness Scale at 2/24 points. The patient's weight 223 pounds with a height of 71 (inches), resulting in a BMI of 31.2 kg/m2.   CURRENT MEDICATIONS: Vitamin D3, Advil, Zinc, Lopressor   PROCEDURE:  This is a multichannel digital polysomnogram utilizing the Somnostar 11.2 system.  Electrodes and sensors were applied and monitored per AASM Specifications.   EEG, EOG, Chin and Limb EMG, were sampled at 200 Hz.  ECG, Snore and Nasal Pressure, Thermal Airflow, Respiratory Effort, CPAP Flow and Pressure, Oximetry was sampled at 50 Hz. Digital video and audio were recorded.      BASELINE STUDY  Lights Out was at 20:27 and Lights On at 04:52.  Total recording time (TRT) was 505.5 minutes, with a total sleep time (TST) of 370.5 minutes.   The patient's sleep latency was 39 minutes, which is delayed. REM latency was 103 minutes, which is normal. The sleep efficiency was 73.3 %.     SLEEP ARCHITECTURE: WASO (Wake after sleep onset) was 64 minutes with mild to moderate sleep fragmentation noted. There were 32.5 minutes in Stage N1, 197.5 minutes Stage N2, 86 minutes Stage N3 and 54.5 minutes in Stage REM.  The percentage of Stage N1 was 8.8%, which is increased, Stage N2 was 53.3%, which is normal, Stage N3 was 23.2% and Stage R (REM sleep) was 14.7%, which is mildly reduced. The arousals were noted as: 40 were  spontaneous, 0 were associated with PLMs, 12 were associated with respiratory events.  RESPIRATORY ANALYSIS:  There were a total of 43 respiratory events:  7 obstructive apneas, 0 central apneas and 0 mixed apneas with a total of 7 apneas and an apnea index (AI) of 1.1 /hour. There were 36 hypopneas with a hypopnea index of 5.8 /hour. The patient also had 0 respiratory event related arousals (RERAs).      The total APNEA/HYPOPNEA INDEX (AHI) was 7./hour and the total RESPIRATORY DISTURBANCE INDEX was  7. /hour.  6 events occurred in REM sleep and 60 events in NREM. The REM AHI was  6.6 /hour, versus a non-REM AHI of 7.. The patient spent 95.5 minutes of total sleep time in the supine position and 275 minutes in non-supine.. The supine AHI was 21.4 versus a non-supine AHI of 2.0.  OXYGEN SATURATION & C02:  The Wake baseline 02 saturation was 96%, with the lowest being 84%. Time spent below 89% saturation equaled 8 minutes.  PERIODIC LIMB MOVEMENTS: The patient had a total of 0 Periodic Limb Movements.  The Periodic Limb Movement (PLM) index was 0 and the PLM Arousal index was 0/hour.  Audio and video analysis did not show any abnormal or unusual movements, behaviors, phonations or vocalizations. The patient took 1 bathroom break. No significant snoring was noted. The EKG was in keeping with normal sinus rhythm (NSR).  Post-study, the patient indicated that sleep was worse than usual.   IMPRESSION:  Obstructive Sleep Apnea (OSA) Dysfunctions associated with sleep stages or arousal from sleep  RECOMMENDATIONS:  This study demonstrates overall mild obstructive sleep apnea, more pronounced (in the moderate range) during supine sleep with a total AHI of 7.0/hour, REM AHI of 6.6/hour, supine AHI of 21.4/hour and O2 nadir of 84% with time below 89% saturation of 8 minutes for the night. The absence of supine REM sleep may have led to some degree of underestimation of his sleep disordered breathing.  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 Alternatively, a full-night CPAP titration study would allow optimization of therapy if needed, down the road.    Please note that untreated obstructive sleep apnea may carry additional perioperative morbidity. Patients with significant obstructive sleep apnea should receive perioperative PAP therapy and the surgeons and particularly the anesthesiologist should be informed of the diagnosis and the severity of the sleep disordered breathing. This study shows sleep fragmentation and mildly abnormal sleep stage percentages; these are nonspecific findings and per se do not signify an intrinsic sleep disorder or a cause for the patient's sleep-related symptoms. Causes include (but are not limited to) the first night effect of the sleep study, circadian rhythm disturbances, medication effect or an underlying mood disorder or medical problem.  The patient should be cautioned not to drive, work at heights, or operate dangerous or heavy equipment when tired or sleepy. Review and reiteration of good sleep hygiene measures should be pursued with any patient. The patient will be seen in follow-up by Dr. Rexene Alberts at [facility] for discussion of the test results and further management strategies as well as monitoring of treatment compliance and benefit. The referring provider will be notified of the test results.  I certify that I have reviewed the entire raw data recording prior to the issuance of this report in accordance with the Standards of Accreditation of the American Academy of Sleep Medicine (AASM)  Star Age, MD, PhD Diplomat, American Board of Neurology and Sleep Medicine (Neurology and Sleep Medicine)

## 2022-03-12 NOTE — Addendum Note (Signed)
Addended by: Star Age on: 03/12/2022 05:59 PM   Modules accepted: Orders

## 2022-03-12 NOTE — Telephone Encounter (Signed)
Pt would like a call from the nurse to discuss sleep study result and need results for FAA. Pt said have deadline to meet

## 2022-03-13 ENCOUNTER — Telehealth: Payer: Self-pay

## 2022-03-13 NOTE — Telephone Encounter (Signed)
I called pt. I advised pt that Dr. Rexene Alberts reviewed their sleep study results and found that pt has mild to moderate osa with possible underestimation since there was absence of supine REM sleep. Dr. Rexene Alberts recommends that pt start an auto pap at home. I reviewed PAP compliance expectations with the pt. Pt is agreeable to starting an auto-PAP. I advised pt that an order will be sent to a DME, [facility], and [facility] will call the pt within about one week after they file with the pt's insurance. [facility] will show the pt how to use the machine, fit for masks, and troubleshoot the auto-PAP if needed. A follow up appt was made for insurance purposes with Dr. Rexene Alberts on 06/06/2022 at 9:45am. Pt verbalized understanding to arrive 15 minutes early and bring their auto-PAP. A letter with all of this information in it will be mailed to the pt as a reminder. I verified with the pt that the address we have on file is correct. Pt verbalized understanding of results. Pt had no questions at this time but was encouraged to call back if questions arise. I have sent the order to [facility] and have received confirmation that they have received the order.

## 2022-03-13 NOTE — Telephone Encounter (Signed)
-----   Message from Star Age, MD sent at 03/12/2022  5:59 PM EDT ----- Patient referred by Dr. April Manson, seen by me on 02/14/22, diagnostic PSG on 03/06/22.    Please call and notify the patient that the recent sleep study did confirm the diagnosis of obstructive sleep apnea. OSA is in the mild to moderate range, with a total AHI of 7.0/hour, REM AHI of 6.6/hour, supine AHI of 21.4/hour and O2 nadir of 84% with time below 89% saturation of 8 minutes for the night. The absence of supine REM sleep may have led to some degree of underestimation of his sleep disordered breathing. 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 We talked about starting autoPAP therapy during our visit, since a recent HST through cardiology indicated moderate OSA. I will place an order for autoPAP and we can seek set up through a DME company (of his choice, or as per insurance requirement). The DME representative will educate him on how to use the machine, how to put the mask on, etc. We will need a FU in sleep clinic for 10 weeks post-PAP set up, please arrange that with me or one of our NPs. Thanks,   Star Age, MD, PhD [facility])

## 2022-03-14 NOTE — Telephone Encounter (Signed)
Pt is and requesting the AHI number off his sleep study report. Pt said he would come by and pick report up when it's ready. Pt said someone can just call him and let him know if he can get this report.

## 2022-03-14 NOTE — Telephone Encounter (Addendum)
I called and spoke to pt.  He dropped off form for FAA.  He states on the form that [place] requires cpap if AHI greater then 16.  I see that AHI on his SS states total AHI 7.  So he does not need cpap.  He states he may like to get anyway if makes him feel better but he states that does not need to placed on FAA form.  If needs to make appt to discuss would do so.  Appreciated call back.  I relayed will look at form and discuss with Dr. Rexene Alberts then go form there.   SS report to front desk for pt to pick up needs to sign release.

## 2022-03-19 NOTE — Telephone Encounter (Signed)
Received form this am to pod.

## 2022-03-19 NOTE — Telephone Encounter (Signed)
Dr Rexene Alberts has form and is completing.

## 2022-03-19 NOTE — Telephone Encounter (Signed)
Pt is asking for a call back to know if the restriction can be taken off re: him needing to use the CPAP, please call at [phone]

## 2022-03-20 NOTE — Telephone Encounter (Signed)
Form completed signed.  I called pt and relayed that form is ready for him to pick up.  He will come by tomorrow to pick up.  Medical records given form/records.

## 2022-03-20 NOTE — Telephone Encounter (Signed)
FAA form completed, treatment for mild obstructive sleep apnea is optional at this point and can be in the form of AutoPap therapy or a dental device.  Please talk to the patient and see if he would like to have an AutoPap machine prescription. If he starts AutoPap therapy, I would recommend a follow-up within 31 to 89 days for compliance.  He can be scheduled with me or nurse practitioner.  If he would like to explore an oral appliance through a dentist, we can also facilitate with a referral. Per FAA form, treatment is mandated for an AHI of 16/h or above.  His AHI overall was 7/h.

## 2022-03-21 DIAGNOSIS — Z0289 Encounter for other administrative examinations: Secondary | ICD-10-CM

## 2022-05-16 ENCOUNTER — Ambulatory Visit: Payer: PPO | Admitting: Dermatology

## 2022-05-16 DIAGNOSIS — L578 Other skin changes due to chronic exposure to nonionizing radiation: Secondary | ICD-10-CM

## 2022-05-16 DIAGNOSIS — I781 Nevus, non-neoplastic: Secondary | ICD-10-CM

## 2022-05-16 NOTE — Patient Instructions (Signed)
Due to recent changes in healthcare laws, you may see results of your pathology and/or laboratory studies on MyChart before the doctors have had a chance to review them. We understand that in some cases there may be results that are confusing or concerning to you. Please understand that not all results are received at the same time and often the doctors may need to interpret multiple results in order to provide you with the best plan of care or course of treatment. Therefore, we ask that you please give us 2 business days to thoroughly review all your results before contacting the office for clarification. Should we see a critical lab result, you will be contacted sooner.   If You Need Anything After Your Visit  If you have any questions or concerns for your doctor, please call our main line at [phone] and press option 4 to reach your doctor's medical assistant. If no one answers, please leave a voicemail as directed and we will return your call as soon as possible. Messages left after 4 pm will be answered the following business day.   You may also send us a message via MyChart. We typically respond to MyChart messages within 1-2 business days.  For prescription refills, please ask your pharmacy to contact our office. Our fax number is [phone].  If you have an urgent issue when the clinic is closed that cannot wait until the next business day, you can page your doctor at the number below.    Please note that while we do our best to be available for urgent issues outside of office hours, we are not available 24/7.   If you have an urgent issue and are unable to reach us, you may choose to seek medical care at your doctor's office, retail clinic, urgent care center, or emergency room.  If you have a medical emergency, please immediately call 911 or go to the emergency department.  Pager Numbers  - Dr. Kowalski: [phone]  - Dr. Moye: [phone]  - Dr. Stewart:  [phone]  In the event of inclement weather, please call our main line at [phone] for an update on the status of any delays or closures.  Dermatology Medication Tips: Please keep the boxes that topical medications come in in order to help keep track of the instructions about where and how to use these. Pharmacies typically print the medication instructions only on the boxes and not directly on the medication tubes.   If your medication is too expensive, please contact our office at [phone] option 4 or send us a message through MyChart.   We are unable to tell what your co-pay for medications will be in advance as this is different depending on your insurance coverage. However, we may be able to find a substitute medication at lower cost or fill out paperwork to get insurance to cover a needed medication.   If a prior authorization is required to get your medication covered by your insurance company, please allow us 1-2 business days to complete this process.  Drug prices often vary depending on where the prescription is filled and some pharmacies may offer cheaper prices.  The website www.goodrx.com contains coupons for medications through different pharmacies. The prices here do not account for what the cost may be with help from insurance (it may be cheaper with your insurance), but the website can give you the price if you did not use any insurance.  - You can print the associated coupon and take it with   your prescription to the pharmacy.  - You may also stop by our office during regular business hours and pick up a GoodRx coupon card.  - If you need your prescription sent electronically to a different pharmacy, notify our office through [place] MyChart or by phone at [phone] option 4.     Si Usted Necesita Algo Despus de Su Visita  Tambin puede enviarnos un mensaje a travs de MyChart. Por lo general respondemos a los mensajes de MyChart en el transcurso de 1 a 2  das hbiles.  Para renovar recetas, por favor pida a su farmacia que se ponga en contacto con nuestra oficina. Nuestro nmero de fax es el [phone].  Si tiene un asunto urgente cuando la clnica est cerrada y que no puede esperar hasta el siguiente da hbil, puede llamar/localizar a su doctor(a) al nmero que aparece a continuacin.   Por favor, tenga en cuenta que aunque hacemos todo lo posible para estar disponibles para asuntos urgentes fuera del horario de oficina, no estamos disponibles las 24 horas del da, los 7 das de la semana.   Si tiene un problema urgente y no puede comunicarse con nosotros, puede optar por buscar atencin mdica  en el consultorio de su doctor(a), en una clnica privada, en un centro de atencin urgente o en una sala de emergencias.  Si tiene una emergencia mdica, por favor llame inmediatamente al 911 o vaya a la sala de emergencias.  Nmeros de bper  - Dr. Kowalski: [phone]  - Dra. Moye: [phone]  - Dra. Stewart: [phone]  En caso de inclemencias del tiempo, por favor llame a nuestra lnea principal al [phone] para una actualizacin sobre el estado de cualquier retraso o cierre.  Consejos para la medicacin en dermatologa: Por favor, guarde las cajas en las que vienen los medicamentos de uso tpico para ayudarle a seguir las instrucciones sobre dnde y cmo usarlos. Las farmacias generalmente imprimen las instrucciones del medicamento slo en las cajas y no directamente en los tubos del medicamento.   Si su medicamento es muy caro, por favor, pngase en contacto con nuestra oficina llamando al [phone] y presione la opcin 4 o envenos un mensaje a travs de MyChart.   No podemos decirle cul ser su copago por los medicamentos por adelantado ya que esto es diferente dependiendo de la cobertura de su seguro. Sin embargo, es posible que podamos encontrar un medicamento sustituto a menor costo o llenar un formulario para que el  seguro cubra el medicamento que se considera necesario.   Si se requiere una autorizacin previa para que su compaa de seguros cubra su medicamento, por favor permtanos de 1 a 2 das hbiles para completar este proceso.  Los precios de los medicamentos varan con frecuencia dependiendo del lugar de dnde se surte la receta y alguna farmacias pueden ofrecer precios ms baratos.  El sitio web www.goodrx.com tiene cupones para medicamentos de diferentes farmacias. Los precios aqu no tienen en cuenta lo que podra costar con la ayuda del seguro (puede ser ms barato con su seguro), pero el sitio web puede darle el precio si no utiliz ningn seguro.  - Puede imprimir el cupn correspondiente y llevarlo con su receta a la farmacia.  - Tambin puede pasar por nuestra oficina durante el horario de atencin regular y recoger una tarjeta de cupones de GoodRx.  - Si necesita que su receta se enve electrnicamente a una farmacia diferente, informe a nuestra oficina a travs de MyChart de [place]   o por telfono llamando al [phone] y presione la opcin 4.  

## 2022-05-16 NOTE — Progress Notes (Unsigned)
   Follow-Up Visit   Subjective  Jorge Obrien is a 66 y.o. male who presents for the following: Skin Problem (Check a red spot changing and growing on his right cheek appeared ~2 months ago.). Other spots to be checked.  The following portions of the chart were reviewed this encounter and updated as appropriate:   Tobacco  Allergies  Meds  Problems  Med Hx  Surg Hx  Fam Hx     Review of Systems:  No other skin or systemic complaints except as noted in HPI or Assessment and Plan.  Objective  Well appearing patient in no apparent distress; mood and affect are within normal limits.  A focused examination was performed including face. Relevant physical exam findings are noted in the Assessment and Plan.  left cheek Dilated blood vessel  Blanches completely by diascopy    Assessment & Plan  Telangiectasia left cheek See photo  discussed the treatment option of BBL/laser.  Typically we recommend 1-3 treatment sessions about 5-8 weeks apart for best results.  The patient's condition may require "maintenance treatments" in the future.  The fee for BBL / laser treatments is $350 per treatment session for the whole face.  A fee can be quoted for other parts of the body. Insurance typically does not pay for BBL/laser treatments and therefore the fee is an out-of-pocket cost.   Patient will call back here to schedule BBL (regular appt for this 1 spot)  Benign-appearing.  Observation.  Call clinic for new or changing lesions.  Recommend daily use of broad spectrum spf 30+ sunscreen to sun-exposed areas.   Actinic Damage - chronic, secondary to cumulative UV radiation exposure/sun exposure over time - diffuse scaly erythematous macules with underlying dyspigmentation - Recommend daily broad spectrum sunscreen SPF 30+ to sun-exposed areas, reapply every 2 hours as needed.  - Recommend staying in the shade or wearing long sleeves, sun glasses (UVA+UVB protection) and wide brim hats  (4-inch brim around the entire circumference of the hat). - Call for new or changing lesions.  Return if symptoms worsen or fail to improve.  IMarye Round, CMA, am acting as scribe for Sarina Ser, MD .  Documentation: I have reviewed the above documentation for accuracy and completeness, and I agree with the above.  Sarina Ser, MD

## 2022-05-17 ENCOUNTER — Encounter: Payer: Self-pay | Admitting: Dermatology

## 2022-06-06 ENCOUNTER — Ambulatory Visit: Payer: PPO | Admitting: Neurology

## 2022-08-15 ENCOUNTER — Ambulatory Visit: Payer: PPO | Admitting: Neurology

## 2022-08-15 ENCOUNTER — Encounter: Payer: Self-pay | Admitting: Neurology

## 2022-08-15 VITALS — BP 134/79 | HR 57 | Ht 71.0 in | Wt 221.6 lb

## 2022-08-15 DIAGNOSIS — G4733 Obstructive sleep apnea (adult) (pediatric): Secondary | ICD-10-CM | POA: Diagnosis not present

## 2022-08-15 NOTE — Progress Notes (Signed)
Subjective:    Patient ID: Jorge Obrien is a 66 y.o. male.  HPI    Interim history, REF: PI # [id number], MID #: [id number], APP ID #: [id number]   Jorge Obrien is a 66 year old right-handed gentleman with an underlying medical history of PSVT, hyperlipidemia, anxiety, depression, history of covid pneumonia, history of seizure-like event in the past, and mild obesity, who presents for follow-up consultation of his mild sleep apnea after interim testing.  The patient is unaccompanied today.  I first met him on 02/14/2022 at the request of Dr. April Obrien, at which time the patient reported a prior diagnosis of sleep apnea.  He has not been on PAP therapy.  He had previously tried a machine but could not tolerate it.  He was advised to proceed with sleep testing.  He had a baseline polysomnogram on 03/06/2022 which showed a sleep latency delayed at 39 minutes, REM latency high normal at 103 minutes, sleep efficiency mildly reduced at 73.3%.  Total AHI was 7/h, REM AHI was 6.6/h, supine AHI in the moderate range at 21.4/h.  Average oxygen saturation was 96%, nadir was 84%, time below 89% saturation was 8 minutes for the night.  He did not have any significant PLM's or EEG or EKG changes.  He was given the option to consider AutoPap therapy.  I felt the treatment of his mild sleep apnea was optional and could be pursued with AutoPap or a dental device or positional treatment only.  Per FAA, treatment for sleep apnea was mandated for an AHI of 16/h or above, his AHI was 7/h.  I signed the [place] form for him as well.   Today, 08/15/2022: He reports feeling well, no episodes of significant daytime somnolence, no new symptoms, sleeps on his sides, he is not a bad sleeper.  He has had no changes in his sleep habits, but wanted to schedule a follow-up visit after his sleep study.  We talked about his baseline sleep study results from 03/06/2022, he had a very mild elevated AHI at 7/h, rising to 21.4/h during supine sleep.  He  feels that he sleeps well on his sides.  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  No new medications.  Takes his metoprolol low-dose, 25 mg twice daily and reports compliance.  He does not have any major sleep disruption.  Epworth sleepiness score is low at 1 out of 24, fatigue severity score also low at 12 out of 63.  The patient's allergies, current medications, family history, past medical history, past social history, past surgical history and problem list were reviewed and updated as appropriate.   Previously:   02/14/22: (He) was previously diagnosed with obstructive sleep apnea.  He is currently not on CPAP or AutoPap therapy.  I reviewed your office note from 01/30/2022.  Prior sleep study results are not available for my review today.  As he recalls, originally, he was diagnosed some 10+ years ago and had a CPAP machine.  He could not tolerate it at the time, tried different masks.  He still has his old machine but has not used it.  About a year ago or little over a year ago his cardiologist ordered a home sleep test.  I do not have the records available for review but patient reports that he tested in the borderline moderate range with an AHI around 15/h.  He has not been really started on PAP therapy. His Epworth sleepiness score is 2 out of 24, fatigue severity score  is 13 out of 63.  He has no family history of sleep apnea as far as he recalls.  He has been a Programme researcher, broadcasting/film/video.  A laboratory attended sleep study was requested or recommended by the [facility].  He would be willing to have a laboratory attended sleep study done.  Bedtime is generally between 9:30 and 10 and rise time around 6 AM.  He does have nocturia about 2 times per average night and denies recurrent morning headaches.  He typically sleeps on his right side.  He is trying to lose weight.  He is divorced, lives alone, has 1 son, age 7.  He drinks caffeine in the form of coffee, about 3 cups in the morning.  He quit drinking  alcohol altogether when he was diagnosed with COVID but recently has restarted drinking beer, about 2/day on average.  He is a non-smoker.  He had a tonsillectomy as a child.  He has a TV in his bedroom but turns it off before falling asleep.  He has no pets in the household.   His Past Medical History Is Significant For: Past Medical History:  Diagnosis Date   Anxiety 02/2006   Anxiety and depression 05/2006   [facility]   Electrolyte disorder 05/2006   [facility]   Hyperlipidemia 09/2006   Pneumonia 05/2006   [facility]   Seizures ([place]) 2012   History of- nightime and was on Klonopin/Dilantin.  Eval at [facility]. Eval by Dr. Erling Cruz    His Past Surgical History Is Significant For: Past Surgical History:  Procedure Laterality Date   TONSILLECTOMY     As a child.    His Family History Is Significant For: Family History  Problem Relation Age of Onset   Cataracts Mother    Hypertension Father    Cancer Father        Brain glioblastoma   Cancer Brother        Testicular CA, pituitary adenoma   Hypertension Brother    Cancer Brother        Melanoma   Alcohol abuse Maternal Uncle    Seizures Other    Heart disease Neg Hx    Diabetes Neg Hx    Stroke Neg Hx    Sleep apnea Neg Hx     His Social History Is Significant For: Social History   Socioeconomic History   Marital status: Married    Spouse name: Not on file   Number of children: 1   Years of education: Not on file   Highest education level: Not on file  Occupational History   Occupation: Optometrist    Employer: [employer]  Tobacco Use   Smoking status: Never   Smokeless tobacco: Not on file  Substance and Sexual Activity   Alcohol use: Not Currently    Alcohol/week: 6.0 standard drinks of alcohol    Types: 6 Cans of beer per week   Drug use: No   Sexual activity: Not on file  Other Topics Concern   Not on file  Social History Narrative   From [place]   Divorced with 1 son (has 50% of time)   Exercise:   Previous jogger, has joined the [employer]   Social Determinants of Radio broadcast assistant Strain: Not on file  Food Insecurity: Not on file  Transportation Needs: Not on file  Physical Activity: Not on file  Stress: Not on file  Social Connections: Not on file    His Allergies  Are:  Allergies  Allergen Reactions   Omnicef [Cefdinir] Rash  :   His Current Medications Are:  Outpatient Encounter Medications as of 08/15/2022  Medication Sig   Cholecalciferol (VITAMIN D3 PO) Take by mouth daily.   ibuprofen (ADVIL,MOTRIN) 200 MG tablet 200 mg. Take 3 tablets every 12 hrs as needed for pain   metoprolol succinate (TOPROL-XL) 25 MG 24 hr tablet Take 25 mg by mouth 2 (two) times daily.   Multiple Vitamins-Minerals (ZINC PO) Take by mouth daily.   metoprolol tartrate (LOPRESSOR) 25 MG tablet Take 1 tablet (25 mg total) by mouth 2 (two) times daily.   No facility-administered encounter medications on file as of 08/15/2022.  :  Review of Systems:  Out of a complete 14 point review of systems, all are reviewed and negative with the exception of these symptoms as listed below: Review of Systems  Neurological:        Pt wants to discuss his diagnosis of sleep apnea  Pt states FAA wants AHI 15 to qualify for sleep apnea  (CPAP machine)  ESS:1    Objective:  Neurological Exam  Physical Exam Physical Examination:   Vitals:   08/15/22 1015  BP: 134/79  Pulse: (!) 57    General Examination: The patient is a very pleasant 66 y.o. male in no acute distress. He appears well-developed and well-nourished and very well groomed.   HEENT: Normocephalic, atraumatic, pupils are equal, round and reactive to light, extraocular tracking is good without limitation to gaze excursion or nystagmus noted. Hearing is grossly intact. Face is symmetric with normal facial animation. Speech is clear with no dysarthria noted. There is no hypophonia. There is no lip, neck/head, jaw or voice  tremor. Neck is supple with full range of passive and active motion. There are no carotid bruits on auscultation. Oropharynx exam reveals: No significant mouth dryness, good dental hygiene and stable findings. Tongue protrudes centrally and palate elevates symmetrically.    Chest: Clear to auscultation without wheezing, rhonchi or crackles noted.   Heart: S1+S2+0, regular and normal without murmurs, rubs or gallops noted.    Abdomen: Soft, non-tender and non-distended.   Extremities: There is no obvious edema in the distal lower extremities bilaterally.    Skin: Warm and dry without trophic changes noted.    Musculoskeletal: exam reveals no obvious joint deformities.    Neurologically:  Mental status: The patient is awake, alert and oriented in all 4 spheres. His immediate and remote memory, attention, language skills and fund of knowledge are appropriate. There is no evidence of aphasia, agnosia, apraxia or anomia. Speech is clear with normal prosody and enunciation. Thought process is linear. Mood is normal and affect is normal.  Cranial nerves II - XII are as described above under HEENT exam.  Motor exam: Normal bulk, strength and tone is noted. There is no obvious tremor.  Fine motor skills and coordination: grossly intact.  Cerebellar testing: No dysmetria or intention tremor. There is no truncal or gait ataxia.  Sensory exam: intact to light touch in the upper and lower extremities.  Gait, station and balance: He stands easily. No veering to one side is noted. No leaning to one side is noted. Posture is age-appropriate and stance is narrow based. Gait shows normal stride length and normal pace. No problems turning are noted.    Assessment and Plan:    In summary, RAFEAL SKIBICKI is a very pleasant 66 year old male with an underlying medical history of PSVT, hyperlipidemia,  anxiety, depression, history of covid pneumonia, history of seizure-like event in the distant past without  recurrence, and borderline obesity, who presents for follow-up consultation of his mild sleep apnea, after interim testing.  I first met him on 02/14/2022 at the request of Dr. April Obrien, at which time the patient reported a prior diagnosis of sleep apnea.  He has not been on PAP therapy.  He had previously tried a machine but could not tolerate it.  He was advised to proceed with sleep testing.  His baseline polysomnogram from 03/06/2022 showed rather mild sleep apnea, with a total AHI of 7/h, worse during supine sleep with a REM AHI of 6.6/h, and supine AHI of 21.4/h.  Average oxygen saturation was 96%, nadir was 84%, time below 89% saturation was 8 minutes for the night.  He did not have any significant PLM's or EEG or EKG changes.  He is again advised that treatment for mild sleep apnea is not imperative.  If he develops symptoms such as daytime somnolence or would like to try an AutoPap machine, I would be happy to pursue this with him.  He can also consider a dental device if he wishes but overall treatment for mild obstructive sleep apnea is optional.  He is a side sleeper and is advised to continue to work on healthy weight, continue to pursue a healthy lifestyle, try to achieve about 7 to 8 hours of sleep on an average night, stay well-hydrated.  Per FAA, treatment for sleep apnea is mandated for an AHI of 16/h or above, his AHI was 7/h.  I have previously signed the [place] form for him as well.  At this juncture, he is advised to follow-up in sleep clinic as needed.  I answered all his questions today and the patient was in agreement.     Star Age, MD, PhD

## 2022-08-15 NOTE — Patient Instructions (Signed)
It was nice to see you again today.  Again, treatment of mild sleep apnea is not imperative, you can continue to pursue healthy lifestyle, healthy weight, good hydration, make enough time for sleep, try to achieve about 7 to 8 hours of sleep on an average night and try to sleep on your sides.  I can see you back as needed.

## 2022-09-12 DIAGNOSIS — Z1212 Encounter for screening for malignant neoplasm of rectum: Secondary | ICD-10-CM | POA: Diagnosis not present

## 2022-09-12 DIAGNOSIS — Z1211 Encounter for screening for malignant neoplasm of colon: Secondary | ICD-10-CM | POA: Diagnosis not present

## 2022-09-26 ENCOUNTER — Ambulatory Visit: Payer: PPO | Admitting: Neurology

## 2022-10-24 DIAGNOSIS — R000 Tachycardia, unspecified: Secondary | ICD-10-CM | POA: Diagnosis not present

## 2022-10-24 DIAGNOSIS — I471 Supraventricular tachycardia, unspecified: Secondary | ICD-10-CM | POA: Diagnosis not present

## 2023-04-24 DIAGNOSIS — R000 Tachycardia, unspecified: Secondary | ICD-10-CM | POA: Diagnosis not present

## 2023-04-24 DIAGNOSIS — I471 Supraventricular tachycardia, unspecified: Secondary | ICD-10-CM | POA: Diagnosis not present

## 2023-10-09 ENCOUNTER — Ambulatory Visit
Admission: EM | Admit: 2023-10-09 | Discharge: 2023-10-09 | Disposition: A | Discharge status: Home / Self Care | Payer: PPO | Attending: Emergency Medicine | Admitting: Emergency Medicine

## 2023-10-09 DIAGNOSIS — J069 Acute upper respiratory infection, unspecified: Secondary | ICD-10-CM | POA: Diagnosis not present

## 2023-10-09 LAB — POC RSV: RSV Antigen, POC: NEGATIVE

## 2023-10-09 NOTE — ED Triage Notes (Addendum)
Patient to Urgent Care with complaints of runny nose. Reports exposure to RSV and would like to be tested.  Symptoms started two weeks ago.  Negative home flu and covid tests.

## 2023-10-09 NOTE — ED Provider Notes (Signed)
Jorge Obrien    CSN: [id number] Arrival date & time: 10/09/23  1333      History   Chief Complaint Chief Complaint  Patient presents with   Nasal Congestion    HPI Jorge Obrien is a 68 y.o. male.  Patient presents with runny nose x 1 week.  He request RSV testing and states he was exposed to RSV.  Negative COVID and flu test at home.  He denies fever, cough, shortness of breath.  No OTC medications taken today.  The history is provided by the patient and medical records.    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   Seizures (HCC) 2012   History of- nightime and was on Klonopin/Dilantin.  Eval at [facility]. Eval by Dr. 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       Home Medications    Prior to Admission medications   Medication Sig Start Date End Date Taking? Authorizing Provider  Cholecalciferol (VITAMIN D3 PO) Take by mouth daily.    [provider]  ibuprofen (ADVIL,MOTRIN) 200 MG tablet 200 mg. Take 3 tablets every 12 hrs as needed for pain    [provider]  metoprolol succinate (TOPROL-XL) 25 MG 24 hr tablet Take 25 mg by mouth 2 (two) times daily.    [provider]  metoprolol tartrate (LOPRESSOR) 25 MG tablet Take 1 tablet (25 mg total) by mouth 2 (two) times daily. 11/09/20 01/30/22  Ward, Layla Maw, DO  Multiple Vitamins-Minerals (ZINC PO) Take by mouth daily.    [provider]     Family History Family History  Problem Relation Age of Onset   Cataracts Mother    Hypertension Father    Cancer Father        Brain glioblastoma   Cancer Brother        Testicular CA, pituitary adenoma   Hypertension Brother    Cancer Brother        Melanoma   Alcohol abuse Maternal Uncle    Seizures Other    Heart disease Neg Hx    Diabetes Neg Hx    Stroke Neg Hx    Sleep apnea Neg Hx     Social History Social History   Tobacco Use   Smoking status: Never  Substance Use Topics   Alcohol use: Not Currently    Alcohol/week: 6.0 standard drinks of alcohol    Types: 6 Cans of beer per week   Drug use: No     Allergies   Omnicef [cefdinir]   Review of Systems Review of Systems  Constitutional:  Negative for chills and fever.  HENT:  Positive for rhinorrhea. Negative for ear pain and sore throat.   Respiratory:  Negative for cough and shortness of breath.      Physical Exam Triage Vital Signs ED Triage Vitals  Encounter Vitals Group  BP      Systolic BP Percentile      Diastolic BP Percentile      Pulse      Resp      Temp      Temp src      SpO2      Weight      Height      Head Circumference      Peak Flow      Pain Score      Pain Loc      Pain Education      Exclude from Growth Chart    No data found.  Updated Vital Signs BP 135/78   Pulse (!) 59   Temp 97.7 F (36.5 C)   Resp 18   SpO2 97%   Visual Acuity Right Eye Distance:   Left Eye Distance:   Bilateral Distance:    Right Eye Near:   Left Eye Near:    Bilateral Near:     Physical Exam Constitutional:      General: He is not in acute distress. HENT:     Right Ear: Tympanic membrane normal.     Left Ear: Tympanic membrane normal.     Nose: Rhinorrhea present.     Mouth/Throat:     Mouth: Mucous membranes are moist.     Pharynx: Oropharynx is clear.  Cardiovascular:     Rate and Rhythm: Normal rate and regular rhythm.     Heart sounds: Normal heart sounds.   Pulmonary:     Effort: Pulmonary effort is normal. No respiratory distress.     Breath sounds: Normal breath sounds.  Neurological:     Mental Status: He is alert.      UC Treatments / Results  Labs (all labs ordered are listed, but only abnormal results are displayed) Labs Reviewed  POC RSV - Normal    EKG   Radiology No results found.  Procedures Procedures (including critical care time)  Medications Ordered in UC Medications - No data to display  Initial Impression / Assessment and Plan / UC Course  I have reviewed the triage vital signs and the nursing notes.  Pertinent labs & imaging results that were available during my care of the patient were reviewed by me and considered in my medical decision making (see chart for details).    Viral URI.  Patient reports exposure to RSV.  He did home test for COVID and flu which were negative.  His lungs are clear and O2 sat is 97% on room air.  RSV test done here and is negative.  Discussed symptomatic management and encouraged patient to follow-up with his PCP if he is not improving.  Education provided on viral respiratory infection.  Patient agrees to plan of care.  Final Clinical Impressions(s) / UC Diagnoses   Final diagnoses:  Viral URI     Discharge Instructions      Your RSV test is negative.  Follow-up with your primary care provider if you are not improving.     ED Prescriptions   None    PDMP not reviewed this encounter.   Mickie Bail, NP 10/09/23 1501

## 2023-10-09 NOTE — Discharge Instructions (Addendum)
Your RSV test is negative.  Follow-up with your primary care provider if you are not improving.

## 2023-10-23 DIAGNOSIS — I471 Supraventricular tachycardia, unspecified: Secondary | ICD-10-CM | POA: Diagnosis not present

## 2023-10-23 DIAGNOSIS — E7800 Pure hypercholesterolemia, unspecified: Secondary | ICD-10-CM | POA: Diagnosis not present

## 2023-10-25 ENCOUNTER — Other Ambulatory Visit: Payer: Self-pay | Admitting: Physician Assistant

## 2023-10-25 DIAGNOSIS — E7800 Pure hypercholesterolemia, unspecified: Secondary | ICD-10-CM

## 2023-11-26 ENCOUNTER — Ambulatory Visit: Admitting: Dermatology

## 2024-04-22 DIAGNOSIS — R002 Palpitations: Secondary | ICD-10-CM | POA: Diagnosis not present

## 2024-04-22 DIAGNOSIS — I471 Supraventricular tachycardia, unspecified: Secondary | ICD-10-CM | POA: Diagnosis not present

## 2024-04-22 DIAGNOSIS — E7800 Pure hypercholesterolemia, unspecified: Secondary | ICD-10-CM | POA: Diagnosis not present

## 2024-06-23 DIAGNOSIS — K3580 Unspecified acute appendicitis: Secondary | ICD-10-CM | POA: Diagnosis not present

## 2024-06-23 DIAGNOSIS — Z79899 Other long term (current) drug therapy: Secondary | ICD-10-CM | POA: Diagnosis not present

## 2024-06-23 DIAGNOSIS — R739 Hyperglycemia, unspecified: Secondary | ICD-10-CM | POA: Diagnosis not present

## 2024-06-23 DIAGNOSIS — Z0000 Encounter for general adult medical examination without abnormal findings: Secondary | ICD-10-CM | POA: Diagnosis not present

## 2024-06-23 DIAGNOSIS — Z125 Encounter for screening for malignant neoplasm of prostate: Secondary | ICD-10-CM | POA: Diagnosis not present

## 2024-06-26 DIAGNOSIS — E538 Deficiency of other specified B group vitamins: Secondary | ICD-10-CM | POA: Diagnosis not present

## 2024-06-26 DIAGNOSIS — K3580 Unspecified acute appendicitis: Secondary | ICD-10-CM | POA: Diagnosis not present

## 2024-06-26 DIAGNOSIS — E782 Mixed hyperlipidemia: Secondary | ICD-10-CM | POA: Diagnosis not present

## 2024-06-26 DIAGNOSIS — Z125 Encounter for screening for malignant neoplasm of prostate: Secondary | ICD-10-CM | POA: Diagnosis not present

## 2024-06-26 DIAGNOSIS — R739 Hyperglycemia, unspecified: Secondary | ICD-10-CM | POA: Diagnosis not present

## 2024-06-26 DIAGNOSIS — Z79899 Other long term (current) drug therapy: Secondary | ICD-10-CM | POA: Diagnosis not present

## 2024-07-27 DIAGNOSIS — E538 Deficiency of other specified B group vitamins: Secondary | ICD-10-CM | POA: Diagnosis not present

## 2024-08-30 ENCOUNTER — Emergency Department (HOSPITAL_COMMUNITY)

## 2024-08-30 ENCOUNTER — Encounter (HOSPITAL_COMMUNITY): Payer: Self-pay

## 2024-08-30 ENCOUNTER — Emergency Department (HOSPITAL_COMMUNITY)
Admission: EM | Admit: 2024-08-30 | Discharge: 2024-08-30 | Disposition: A | Discharge status: Home / Self Care | Attending: Emergency Medicine | Admitting: Emergency Medicine

## 2024-08-30 ENCOUNTER — Other Ambulatory Visit: Payer: Self-pay

## 2024-08-30 ENCOUNTER — Encounter (HOSPITAL_COMMUNITY): Payer: Self-pay | Admitting: Emergency Medicine

## 2024-08-30 DIAGNOSIS — K469 Unspecified abdominal hernia without obstruction or gangrene: Secondary | ICD-10-CM | POA: Diagnosis present

## 2024-08-30 DIAGNOSIS — K429 Umbilical hernia without obstruction or gangrene: Secondary | ICD-10-CM | POA: Diagnosis not present

## 2024-08-30 DIAGNOSIS — K439 Ventral hernia without obstruction or gangrene: Secondary | ICD-10-CM | POA: Insufficient documentation

## 2024-08-30 DIAGNOSIS — R109 Unspecified abdominal pain: Secondary | ICD-10-CM | POA: Diagnosis present

## 2024-08-30 LAB — COMPREHENSIVE METABOLIC PANEL WITH GFR
ALT: 34 U/L (ref 0–44)
AST: 36 U/L (ref 15–41)
Albumin: 4.2 g/dL (ref 3.5–5.0)
Alkaline Phosphatase: 76 U/L (ref 38–126)
Anion gap: 10 (ref 5–15)
BUN: 15 mg/dL (ref 8–23)
CO2: 25 mmol/L (ref 22–32)
Calcium: 8.9 mg/dL (ref 8.9–10.3)
Chloride: 103 mmol/L (ref 98–111)
Creatinine, Ser: 1.08 mg/dL (ref 0.61–1.24)
GFR, Estimated: >60 mL/min
Glucose, Bld: 120 mg/dL — ABNORMAL HIGH (ref 70–99)
Potassium: 4.7 mmol/L (ref 3.5–5.1)
Sodium: 137 mmol/L (ref 135–145)
Total Bilirubin: 0.4 mg/dL (ref 0.0–1.2)
Total Protein: 6.7 g/dL (ref 6.5–8.1)

## 2024-08-30 LAB — CBC WITH DIFFERENTIAL/PLATELET
Abs Immature Granulocytes: 0.02 K/uL (ref 0.00–0.07)
Basophils Absolute: 0 K/uL (ref 0.0–0.1)
Basophils Relative: 0 %
Eosinophils Absolute: 0.1 K/uL (ref 0.0–0.5)
Eosinophils Relative: 2 %
HCT: 46.3 % (ref 39.0–52.0)
Hemoglobin: 16.3 g/dL (ref 13.0–17.0)
Immature Granulocytes: 0 %
Lymphocytes Relative: 17 %
Lymphs Abs: 1.2 K/uL (ref 0.7–4.0)
MCH: 32.5 pg (ref 26.0–34.0)
MCHC: 35.2 g/dL (ref 30.0–36.0)
MCV: 92.4 fL (ref 80.0–100.0)
Monocytes Absolute: 0.6 K/uL (ref 0.1–1.0)
Monocytes Relative: 8 %
Neutro Abs: 4.9 K/uL (ref 1.7–7.7)
Neutrophils Relative %: 73 %
Platelets: 175 K/uL (ref 150–400)
RBC: 5.01 MIL/uL (ref 4.22–5.81)
RDW: 11.9 % (ref 11.5–15.5)
WBC: 6.9 K/uL (ref 4.0–10.5)
nRBC: 0 % (ref 0.0–0.2)

## 2024-08-30 MED ORDER — IOHEXOL 300 MG/ML  SOLN
100.0000 mL | Freq: Once | INTRAMUSCULAR | Status: AC | PRN
  Administered 2024-08-30: 100 mL via INTRAVENOUS

## 2024-08-30 NOTE — ED Triage Notes (Signed)
" °  Patient comes in with umbilical hernia pain.  Patient states he has had the hernia for about 4 months and decided to try and push it in around 2200 last night.  Patient states it became painful and red around the area.  Was concerned for possible incarceration.  Denies any N/V.  Pain 2/10, sharp.  "

## 2024-08-30 NOTE — ED Notes (Signed)
 RN notified about elevated bp

## 2024-08-30 NOTE — ED Provider Notes (Signed)
 " [place] EMERGENCY DEPARTMENT AT [facility] Provider Note   CSN: [id number] Arrival date & time: 08/30/24  1536     Patient presents with: Hernia   Jorge Obrien is a 69 y.o. male.   HPI Patient presents for second time in 12 hours with concern for ongoing discomfort around periumbilical area. Patient seen, evaluated overnight for clinical concerns, notes that upon returning home, his family members felt as though the redness was extending inferiorly, and with concern for possible progression of hernia versus strangulation he presents for evaluation. He denies interval vomiting, fever, chest pain, dyspnea, cough.  He notes that he has had COVID-pneumonia in the past.  He also had a bowel movement about 1 hour ago.     Prior to Admission medications  Medication Sig Start Date End Date Taking? Authorizing Provider  Cholecalciferol (VITAMIN D3 PO) Take by mouth daily.    [provider]  ibuprofen (ADVIL,MOTRIN) 200 MG tablet 200 mg. Take 3 tablets every 12 hrs as needed for pain    [provider]  metoprolol  succinate (TOPROL -XL) 25 MG 24 hr tablet Take 25 mg by mouth 2 (two) times daily.    [provider]  metoprolol  tartrate (LOPRESSOR ) 25 MG tablet Take 1 tablet (25 mg total) by mouth 2 (two) times daily. 11/09/20 01/30/22  Ward, Josette SAILOR, DO  Multiple Vitamins-Minerals (ZINC PO) Take by mouth daily.    [provider]    Allergies: Omnicef [cefdinir]    Review of Systems  Updated Vital Signs BP (!) 159/102   Pulse 100   Temp 97.8 F (36.6 C) (Oral)   Resp 15   Ht 1.803 m (5' 11)   Wt 104 kg   SpO2 95%   BMI 31.98 kg/m   Physical Exam Vitals and nursing note reviewed.  Constitutional:      General: He is not in acute distress.    Appearance: He is well-developed.  HENT:     Head: Normocephalic and atraumatic.  Eyes:     Conjunctiva/sclera: Conjunctivae normal.  Cardiovascular:     Rate and Rhythm: Normal rate  and regular rhythm.  Pulmonary:     Effort: Pulmonary effort is normal. No respiratory distress.     Breath sounds: No stridor.  Abdominal:     General: There is no distension.   Skin:    General: Skin is warm and dry.  Neurological:     Mental Status: He is alert and oriented to person, place, and time.     (all labs ordered are listed, but only abnormal results are displayed) Labs Reviewed - No data to display  EKG: None  Radiology: CT ABDOMEN PELVIS W CONTRAST Result Date: 08/30/2024 EXAM: CT ABDOMEN AND PELVIS WITH CONTRAST 08/30/2024 03:47:53 AM TECHNIQUE: CT of the abdomen and pelvis was performed with the administration of 100 mL of iohexol  (OMNIPAQUE ) 300 MG/ML solution. Multiplanar reformatted images are provided for review. Automated exposure control, iterative reconstruction, and/or weight-based adjustment of the mA/kV was utilized to reduce the radiation dose to as low as reasonably achievable. COMPARISON: None available. CLINICAL HISTORY: hernia hernia FINDINGS: LOWER CHEST: There is a focal patchy ground glass opacity present in the posterior base of the right lower lobe. LIVER: The liver is unremarkable. GALLBLADDER AND BILE DUCTS: Gallbladder is unremarkable. No biliary ductal dilatation. SPLEEN: No acute abnormality. PANCREAS: No acute abnormality. ADRENAL GLANDS: No acute abnormality. KIDNEYS, URETERS AND BLADDER: No stones in the kidneys or ureters. No hydronephrosis. No  perinephric or periureteral stranding. Urinary bladder is unremarkable. GI AND BOWEL: A small sliding hiatus hernia is present. Stomach demonstrates no acute abnormality. There is no bowel obstruction. PERITONEUM AND RETROPERITONEUM: There is a midline upper abdominal fat-containing incisional hernia present, with the aperture measuring approximately 3.2 x 2.2 cm in cross-sectional diameter. There is also a small left periumbilical fat-containing hernia. There is also a small left inguinal fat-containing  hernia. No ascites. No free air. VASCULATURE: Aorta is normal in caliber. LYMPH NODES: No lymphadenopathy. REPRODUCTIVE ORGANS: No acute abnormality. BONES AND SOFT TISSUES: No acute osseous abnormality. No focal soft tissue abnormality. IMPRESSION: 1. Focal patchy ground glass opacity in the posterior base of the right lower lobe, nonspecific and may be infectious/inflammatory. 2. Midline upper abdominal fat-containing incisional hernia, with the aperture measuring approximately 3.2 x 2.2 cm in cross-sectional diameter. 3. Small left periumbilical fat-containing hernia. 4. Small left inguinal fat-containing hernia. 5. Small sliding hiatus hernia. Electronically signed by: Evalene Coho MD 08/30/2024 04:06 AM EST RP Workstation: [id number]     Procedures   Medications Ordered in the ED - No data to display                                  Medical Decision Making Adult male presents with secondary 12 hours concern for ongoing discomfort around umbilicus. Patient's CT scan reviewed, been demonstrated at bedside including illustration of no bowel loops in the hernia of concern.  Conversation about hernias, options for repair, concerning features, return precautions, and with his description no fever, vomiting, chills, cough, and recent bowel movement, we discussed risks and benefits of additional CT imaging now within 12 hours versus close outpatient follow-up, patient amenable to this, will call tomorrow to our surgical colleagues. CT interpretation with possible pneumonia right lower lobe inconsistent with patient's clinical exam.   Amount and/or Complexity of Data Reviewed External Data Reviewed: notes.    Details: ED note from earlier today reviewed Labs:  Decision-making details documented in ED Course.    Details: Hyperglycemia only substantial abnormality on earlier labs Radiology: independent interpretation performed. Decision-making details documented in ED Course.    Details: At  bedside I demonstrated the images to the patient self  Risk Decision regarding hospitalization.   Final diagnoses:  Hernia of abdominal cavity    ED Discharge Orders     None          Garrick Charleston, MD 08/30/24 1617  "

## 2024-08-30 NOTE — ED Triage Notes (Addendum)
 Patient has an umbilical hernia. Was seen last night. The pain is worsening. Redness is spreading wider across his abdomen. No vomiting. Had a bowel movement 1 hour ago. Wanted a 2nd opinion to make sure it is not incarcerated.

## 2024-08-30 NOTE — ED Provider Notes (Signed)
 " [place] EMERGENCY DEPARTMENT AT [facility] Provider Note   CSN: [id number] Arrival date & time: 08/30/24  0130     Patient presents with: Hernia   ORRY SIGL is a 69 y.o. male.   HPI     This is a 69 year old male who presents with concern for hernia.  Patient reports he has known hernia.  He is actually seeing general surgeon regarding the hernia.  He has noted protrusion at his bellybutton for approximately 4 months.  Yesterday he noted it was slightly painful.  He tried to push it in.  Since that time he has noted some slight redness to the area.  No nausea, vomiting.  He has had a normal bowel movement since that time.  He reports point tenderness at the hernia site.  He is concerned it may be incarcerated.  Prior to Admission medications  Medication Sig Start Date End Date Taking? Authorizing Provider  Cholecalciferol (VITAMIN D3 PO) Take by mouth daily.    [provider]  ibuprofen (ADVIL,MOTRIN) 200 MG tablet 200 mg. Take 3 tablets every 12 hrs as needed for pain    [provider]  metoprolol  succinate (TOPROL -XL) 25 MG 24 hr tablet Take 25 mg by mouth 2 (two) times daily.    [provider]  metoprolol  tartrate (LOPRESSOR ) 25 MG tablet Take 1 tablet (25 mg total) by mouth 2 (two) times daily. 11/09/20 01/30/22  Ward, Josette SAILOR, DO  Multiple Vitamins-Minerals (ZINC PO) Take by mouth daily.    [provider]    Allergies: Omnicef [cefdinir]    Review of Systems  Constitutional:  Negative for fever.  Respiratory:  Negative for shortness of breath.   Cardiovascular:  Negative for chest pain.  Gastrointestinal:  Positive for abdominal pain. Negative for constipation, nausea and vomiting.  All other systems reviewed and are negative.   Updated Vital Signs BP (!) 171/108   Pulse 64   Temp (!) 97.4 F (36.3 C) (Oral)   Resp 16   Ht 1.803 m (5' 11)   Wt 104.3 kg   SpO2 96%   BMI 32.08 kg/m   Physical Exam Vitals  and nursing note reviewed.  Constitutional:      Appearance: He is well-developed.  HENT:     Head: Normocephalic and atraumatic.  Eyes:     Pupils: Pupils are equal, round, and reactive to light.  Cardiovascular:     Rate and Rhythm: Normal rate and regular rhythm.     Heart sounds: Normal heart sounds. No murmur heard. Pulmonary:     Effort: Pulmonary effort is normal. No respiratory distress.     Breath sounds: Normal breath sounds. No wheezing.  Abdominal:     General: Bowel sounds are normal.     Palpations: Abdomen is soft.     Tenderness: There is no rebound.     Comments: Hernia noted just right lateral of midline, large, reducible, umbilical hernia noted with slight erythema, easily reducible, slight tenderness to palpation  Musculoskeletal:     Cervical back: Neck supple.  Lymphadenopathy:     Cervical: No cervical adenopathy.  Skin:    General: Skin is warm and dry.  Neurological:     Mental Status: He is alert and oriented to person, place, and time.  Psychiatric:        Mood and Affect: Mood normal.     (all labs ordered are listed, but only abnormal results are displayed) Labs Reviewed  COMPREHENSIVE METABOLIC  PANEL WITH GFR - Abnormal; Notable for the following components:      Result Value   Glucose, Bld 120 (*)    All other components within normal limits  CBC WITH DIFFERENTIAL/PLATELET    EKG: None  Radiology: CT ABDOMEN PELVIS W CONTRAST Result Date: 08/30/2024 EXAM: CT ABDOMEN AND PELVIS WITH CONTRAST 08/30/2024 03:47:53 AM TECHNIQUE: CT of the abdomen and pelvis was performed with the administration of 100 mL of iohexol  (OMNIPAQUE ) 300 MG/ML solution. Multiplanar reformatted images are provided for review. Automated exposure control, iterative reconstruction, and/or weight-based adjustment of the mA/kV was utilized to reduce the radiation dose to as low as reasonably achievable. COMPARISON: None available. CLINICAL HISTORY: hernia hernia FINDINGS:  LOWER CHEST: There is a focal patchy ground glass opacity present in the posterior base of the right lower lobe. LIVER: The liver is unremarkable. GALLBLADDER AND BILE DUCTS: Gallbladder is unremarkable. No biliary ductal dilatation. SPLEEN: No acute abnormality. PANCREAS: No acute abnormality. ADRENAL GLANDS: No acute abnormality. KIDNEYS, URETERS AND BLADDER: No stones in the kidneys or ureters. No hydronephrosis. No perinephric or periureteral stranding. Urinary bladder is unremarkable. GI AND BOWEL: A small sliding hiatus hernia is present. Stomach demonstrates no acute abnormality. There is no bowel obstruction. PERITONEUM AND RETROPERITONEUM: There is a midline upper abdominal fat-containing incisional hernia present, with the aperture measuring approximately 3.2 x 2.2 cm in cross-sectional diameter. There is also a small left periumbilical fat-containing hernia. There is also a small left inguinal fat-containing hernia. No ascites. No free air. VASCULATURE: Aorta is normal in caliber. LYMPH NODES: No lymphadenopathy. REPRODUCTIVE ORGANS: No acute abnormality. BONES AND SOFT TISSUES: No acute osseous abnormality. No focal soft tissue abnormality. IMPRESSION: 1. Focal patchy ground glass opacity in the posterior base of the right lower lobe, nonspecific and may be infectious/inflammatory. 2. Midline upper abdominal fat-containing incisional hernia, with the aperture measuring approximately 3.2 x 2.2 cm in cross-sectional diameter. 3. Small left periumbilical fat-containing hernia. 4. Small left inguinal fat-containing hernia. 5. Small sliding hiatus hernia. Electronically signed by: Evalene Coho MD 08/30/2024 04:06 AM EST RP Workstation: [id number]     Procedures   Medications Ordered in the ED  iohexol  (OMNIPAQUE ) 300 MG/ML solution 100 mL (100 mLs Intravenous Contrast Given 08/30/24 0344)                                    Medical Decision Making Amount and/or Complexity of Data  Reviewed Radiology: ordered.  Risk Prescription drug management.   This patient presents to the ED for concern of hernia, this involves an extensive number of treatment options, and is a complaint that carries with it a high risk of complications and morbidity.  I considered the following differential and admission for this acute, potentially life threatening condition.  The differential diagnosis includes incarcerated hernia, strangulated, obstruction, fat-containing hernia with some necrosis causing pain  MDM:    This is a 69 year old male who presents with concern for hernia.  He is nontoxic.  Initially hypertensive to 193/120.  This improved to 171/108.  He has multiple reducible hernias on exam.  Slight erythema at the umbilical hernia with slight tenderness to palpation.  No signs of peritonitis.  No obstructive symptoms.  Labs obtained and reviewed and reassuring.  CT scan shows multiple hernias.  He has a upper incisional hernia that is fairly large.  He has a small periumbilical and inguinal hernia both fat-containing.  He  also has a sliding hiatal hernia.  Incidentally noted to have some patchy ground glass opacity in the right lower lobe.  He has no clinical exam findings concerning for pneumonia.  No respiratory distress or shortness of breath.  No evidence of incarceration or strangulation on exam.  Overall reassuring.  Recommend that he follow back up with general surgery.  (Labs, imaging, consults)  Labs: I Ordered, and personally interpreted labs.  The pertinent results include: CBC, CMP  Imaging Studies ordered: I ordered imaging studies including CT I independently visualized and interpreted imaging. I agree with the radiologist interpretation  Additional history obtained from .  Chart review external records from outside source obtained and reviewed including prior evaluations  Cardiac Monitoring: The patient was not maintained on a cardiac monitor.  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   Seizures (HCC) 2012   History of- nightime and was on Klonopin/Dilantin.  Eval at [facility]. Eval by Dr. Maurice     Medicines Meds ordered this encounter  Medications   iohexol  (OMNIPAQUE ) 300 MG/ML solution 100 mL    I have reviewed the patients home medicines and have made adjustments as needed  Problem List / ED Course: Problem List Items Addressed This Visit   None Visit Diagnoses       Umbilical hernia without obstruction and without gangrene    -  Primary     Ventral hernia without obstruction or gangrene                    Final diagnoses:  Umbilical hernia without obstruction and without gangrene  Ventral hernia without obstruction or gangrene    ED Discharge Orders     None          Bari Charmaine FALCON, MD 08/30/24 0430  "

## 2024-08-30 NOTE — Discharge Instructions (Signed)
 You were seen today with concern for hernia.  You do have multiple hernias including a ventral hernia, umbilical hernia, and inguinal hernia.  None of them are incarcerated or strangulated at this time.  Follow-up with your general surgeon.  If you develop nausea, vomiting, protrusions that do not reduce, or difficulty having bowel movements, this would be a reason to be reevaluated.

## 2024-08-30 NOTE — Discharge Instructions (Signed)
 Are as we discussed, and demonstrated on the computer monitor, you have 2 hernias in your abdominal cavity.  Is a 40 follow-up with our general surgery team tomorrow.  Please call for appointment. For pain control, please use warm compresses, diclofenac topical cream.  Return here for concerning changes in your condition.

## 2024-09-03 ENCOUNTER — Other Ambulatory Visit: Payer: Self-pay

## 2024-09-03 ENCOUNTER — Emergency Department (HOSPITAL_COMMUNITY)
Admission: EM | Admit: 2024-09-03 | Discharge: 2024-09-03 | Disposition: A | Discharge status: Home / Self Care | Attending: Emergency Medicine | Admitting: Emergency Medicine

## 2024-09-03 DIAGNOSIS — K429 Umbilical hernia without obstruction or gangrene: Secondary | ICD-10-CM | POA: Diagnosis present

## 2024-09-03 DIAGNOSIS — K469 Unspecified abdominal hernia without obstruction or gangrene: Secondary | ICD-10-CM

## 2024-09-03 NOTE — ED Triage Notes (Signed)
 Pt has a known abdominal hernia- has been using Voltaren gel for pain, but today noticed blood coming from his belly button

## 2024-09-03 NOTE — Discharge Instructions (Addendum)
 I recommend using some antibiotic ointment on the area 2 times per day.  You can place some gauze over the area.  There like will continue to be a small amount of bleeding over the next few days while this area heals.  Please follow-up with your primary care doctor.

## 2024-09-03 NOTE — ED Provider Notes (Signed)
" °  [place] [facility] AT [facility] Provider Note   CSN: [id number] Arrival date & time: 09/03/24  1228     Patient presents with: Hernia   Jorge Obrien is a 69 y.o. male.   Jorge Obrien 74-year-old male here today with some blood coming from his bellybutton.  He has a history of a known fat-containing inguinal hernia, was prescribed some Voltaren gel, started to have some bleeding from his umbilicus.        Prior to Admission medications  Medication Sig Start Date End Date Taking? Authorizing Provider  Cholecalciferol (VITAMIN D3 PO) Take by mouth daily.    [provider]  ibuprofen (ADVIL,MOTRIN) 200 MG tablet 200 mg. Take 3 tablets every 12 hrs as needed for pain    [provider]  metoprolol  succinate (TOPROL -XL) 25 MG 24 hr tablet Take 25 mg by mouth 2 (two) times daily.    [provider]  metoprolol  tartrate (LOPRESSOR ) 25 MG tablet Take 1 tablet (25 mg total) by mouth 2 (two) times daily. 11/09/20 01/30/22  Ward, Josette SAILOR, DO  Multiple Vitamins-Minerals (ZINC PO) Take by mouth daily.    [provider]    Allergies: Omnicef [cefdinir]    Review of Systems  Updated Vital Signs BP (!) 161/99 (BP Location: Left Arm)   Pulse 76   Temp 97.8 F (36.6 C) (Oral)   Resp 18   SpO2 97%   Physical Exam Vitals and nursing note reviewed.  Constitutional:      Appearance: He is not toxic-appearing.  Abdominal:     General: Abdomen is flat.     Comments: There is a soft, umbilical hernia, no pus purulence or erythema.  There is some scant blood from the inferior portion.  No obvious visible vessel.  Skin:    General: Skin is warm.  Neurological:     Mental Status: He is alert.     (all labs ordered are listed, but only abnormal results are displayed) Labs Reviewed - No data to display  EKG: None  Radiology: No results found.   Procedures   Medications Ordered in the ED - No data to display                                   Medical Decision Making 69 year old male here today with bleeding around his umbilical hernia.  Plan -no evidence of strangulation, and it is only a fat-containing area, reviewed his recent CT imaging.  Looks as though there is some minor skin irritation on the inferior aspect.  There is no brisk arterial bleeding.  Provided the patient with some gauze here in the ED, advised him to hold off on the Voltaren gel, use some antibiotic ointment primarily for the petroleum jelly protection.  He will follow-up with his PCP.        Final diagnoses:  Hernia of abdominal cavity    ED Discharge Orders     None          Jorge Fairy ONEIDA, DO 09/03/24 1415  "
